# Patient Record
Sex: Male | Born: 1950 | Race: Black or African American | Hispanic: No | Marital: Married | Smoking: Former smoker
Health system: Southern US, Community
[De-identification: ages and names within clinical notes are randomized; demographics above are authoritative.]

## PROBLEM LIST (undated history)

## (undated) DIAGNOSIS — C84A0 Cutaneous T-cell lymphoma, unspecified, unspecified site: Secondary | ICD-10-CM

## (undated) DIAGNOSIS — E119 Type 2 diabetes mellitus without complications: Secondary | ICD-10-CM

## (undated) DIAGNOSIS — I10 Essential (primary) hypertension: Secondary | ICD-10-CM

## (undated) DIAGNOSIS — Z227 Latent tuberculosis: Secondary | ICD-10-CM

## (undated) DIAGNOSIS — C8400 Mycosis fungoides, unspecified site: Secondary | ICD-10-CM

## (undated) DIAGNOSIS — C4490 Unspecified malignant neoplasm of skin, unspecified: Secondary | ICD-10-CM

## (undated) HISTORY — PX: FRACTURE SURGERY: SHX138

## (undated) HISTORY — DX: Type 2 diabetes mellitus without complications: E11.9

## (undated) HISTORY — DX: Essential (primary) hypertension: I10

## (undated) HISTORY — DX: Cutaneous T-cell lymphoma, unspecified, unspecified site: C84.A0

---

## 2018-06-20 NOTE — Progress Notes (Deleted)
Patient's Name: Jose Randolph  MRN: [id number]  Referring Provider: Jodi Marble, MD  DOB: [birth year]  PCP: No primary care provider on file.  DOS: 06/25/2018  Note by: Gillis Santa, MD  Service setting: Ambulatory outpatient  Specialty: Interventional Pain Management  Location: [facility] Virtual Visit  Visit type: Initial Patient Evaluation  Patient type: New Patient   Pain Management Virtual Encounter Note - Virtual Visit via  ***  Telehealth (real-time audio visits between healthcare provider and patient).  Patient's Phone No.:  [phone] (home); There is no such number on file (mobile).; (Preferred) [phone] [email or web address] No Pharmacies Listed  Pre-screening note:  Our staff contacted Jose Randolph and offered him an "in person", "face-to-face" appointment versus a telephone encounter. He indicated preferring the telephone encounter, at this time.  Primary Reason(s) for Visit: Tele-Encounter for initial evaluation of one or more chronic problems (new to examiner) potentially causing chronic pain, and posing a threat to normal musculoskeletal function. (Level of risk: High) CC: No chief complaint on file.  I contacted Jose Randolph on 06/25/2018 at 9:50 AM via  ***  and clearly identified myself as Gillis Santa, MD. I verified that I was speaking with the correct person using two identifiers (Name and date of birth: 1950/10/01).  Advanced Informed Consent I sought verbal advanced consent from Jose Randolph for virtual visit interactions. I informed Jose Randolph of possible security and privacy concerns, risks, and limitations associated with providing "not-in-person" medical evaluation and management services. I also informed Jose Randolph of the availability of "in-person" appointments. Finally, I informed him that there would be a charge for the virtual visit and that he could be  personally, fully or partially, financially responsible for it. Jose Randolph expressed  understanding and agreed to proceed.   HPI  Jose Randolph is a 68 y.o. year old, male patient, contacted today for an initial evaluation of his chronic pain. He does not have a problem list on file.  Pain Assessment: Location:     Radiating:   Onset:   Duration:   Quality:   Severity:  /10 (subjective, self-reported pain score)  Note: Reported level is compatible with observation.                         When using our objective Pain Scale, levels between 6 and 10/10 are said to belong in an emergency room, as it progressively worsens from a 6/10, described as severely limiting, requiring emergency care not usually available at an outpatient pain management facility. At a 6/10 level, communication becomes difficult and requires great effort. Assistance to reach the emergency department may be required. Facial flushing and profuse sweating along with potentially dangerous increases in heart rate and blood pressure will be evident. Effect on ADL:   Timing:   Modifying factors:   BP:    HR:    Onset and Duration: {Hx; Onset and Duration:210120511} Cause of pain: {Hx; Cause:210120521} Severity: {Pain Severity:210120502} Timing: {Symptoms; Timing:210120501} Aggravating Factors: {Causes; Aggravating pain factors:210120507} Alleviating Factors: {Causes; Alleviating Factors:210120500} Associated Problems: {Hx; Associated problems:210120515} Quality of Pain: {Hx; Symptom quality or Descriptor:210120531} Previous Examinations or Tests: {Hx; Previous examinations or test:210120529} Previous Treatments: {Hx; Previous Treatment:210120503}  ***  The patient was informed that my practice is divided into two sections: an interventional pain management section, as well as a completely separate and distinct medication management section. I explained that I have procedure days for my interventional therapies, and evaluation days  for follow-ups and medication management. Because of the amount of  documentation required during both, they are kept separated. This means that there is the possibility that he may be scheduled for a procedure on one day, and medication management the next. I have also informed him that because of staffing and facility limitations, I no longer take patients for medication management only. To illustrate the reasons for this, I gave the patient the example of surgeons, and how inappropriate it would be to refer a patient to his/her care, just to write for the post-surgical antibiotics on a surgery done by a different surgeon.   Because interventional pain management is my board-certified specialty, the patient was informed that joining my practice means that they are open to any and all interventional therapies. I made it clear that this does not mean that they will be forced to have any procedures done. What this means is that I believe interventional therapies to be essential part of the diagnosis and proper management of chronic pain conditions. Therefore, patients not interested in these interventional alternatives will be better served under the care of a different practitioner.  The patient was also made aware of my Comprehensive Pain Management Safety Guidelines where by joining my practice, they limit all of their nerve blocks and joint injections to those done by our practice, for as long as we are retained to manage their care.   Historic Controlled Substance Pharmacotherapy Review  PMP and historical list of controlled substances: ***  Highest opioid analgesic regimen found: ***  Most recent opioid analgesic: ***  Current opioid analgesics: ***  Highest recorded MME/day: *** mg/day MME/day: *** mg/day Medications: The patient did not bring the medication(s) to the appointment, as requested in our "New Patient Package" Pharmacodynamics: Desired effects: Analgesia: The patient reports >50% benefit. Reported improvement in function: The patient reports  medication allows him to accomplish basic ADLs. Clinically meaningful improvement in function (CMIF): Sustained CMIF goals met Perceived effectiveness: Described as relatively effective, allowing for increase in activities of daily living (ADL) Undesirable effects: Side-effects or Adverse reactions: None reported Historical Monitoring: The patient  has no history on file for drug. List of all UDS Test(s): No results found for: MDMA, COCAINSCRNUR, [place], [place], CANNABQUANT, [place], [place] List of other Serum/Urine Drug Screening Test(s):  No results found for: AMPHSCRSER, BARBSCRSER, BENZOSCRSER, COCAINSCRSER, COCAINSCRNUR, PCPSCRSER, PCPQUANT, THCSCRSER, THCU, CANNABQUANT, OPIATESCRSER, OXYSCRSER, PROPOXSCRSER, ETH Historical Background Evaluation: [place] PMP: PDMP not reviewed this encounter. Six (6) year initial data search conducted.             PMP NARX Score Report:  Narcotic: *** Sedative: *** Stimulant: *** [place] Department of public safety, offender search: Editor, commissioning Information) Non-contributory Risk Assessment Profile: Aberrant behavior: None observed or detected today Risk factors for fatal opioid overdose: None identified today PMP NARX Overdose Risk Score: *** Fatal overdose hazard ratio (HR): Calculation deferred Non-fatal overdose hazard ratio (HR): Calculation deferred Risk of opioid abuse or dependence: 0.7-3.0% with doses ? 36 MME/day and 6.1-26% with doses ? 120 MME/day. Substance use disorder (SUD) risk level: See below Personal History of Substance Abuse (SUD-Substance use disorder):  Alcohol:    Illegal Drugs:    Rx Drugs:    ORT Risk Level calculation:    ORT Scoring interpretation table:  Score <3 = Low Risk for SUD  Score between 4-7 = Moderate Risk for SUD  Score >8 = High Risk for Opioid Abuse   PHQ-2 Depression Scale:  Total score:  PHQ-2 Scoring interpretation table: (Score and probability of major depressive disorder)  Score 0 = No depression  Score 1  = 15.4% Probability  Score 2 = 21.1% Probability  Score 3 = 38.4% Probability  Score 4 = 45.5% Probability  Score 5 = 56.4% Probability  Score 6 = 78.6% Probability   PHQ-9 Depression Scale:  Total score:    PHQ-9 Scoring interpretation table:  Score 0-4 = No depression  Score 5-9 = Mild depression  Score 10-14 = Moderate depression  Score 15-19 = Moderately severe depression  Score 20-27 = Severe depression (2.4 times higher risk of SUD and 2.89 times higher risk of overuse)   Pharmacologic Plan: As per protocol, I have not taken over any controlled substance management, pending the results of ordered tests and/or consults.            Initial impression: Pending review of available data and ordered tests.  Meds  No current outpatient medications on file.  ROS  Cardiovascular: {Hx; Cardiovascular History:210120525} Pulmonary or Respiratory: {Hx; Pumonary and/or Respiratory History:210120523} Neurological: {Hx; Neurological:210120504} Review of Past Neurological Studies: No results found for this or any previous visit. Psychological-Psychiatric: {Hx; Psychological-Psychiatric History:210120512} Gastrointestinal: {Hx; Gastrointestinal:210120527} Genitourinary: {Hx; Genitourinary:210120506} Hematological: {Hx; Hematological:210120510} Endocrine: {Hx; Endocrine history:210120509} Rheumatologic: {Hx; Rheumatological:210120530} Musculoskeletal: {Hx; Musculoskeletal:210120528} Work History: {Hx; Work history:210120514}  Allergies  Mr. Jenison has no allergies on file.  Laboratory Chemistry  Inflammation Markers (CRP: Acute Phase) (ESR: Chronic Phase) No results found for: CRP, ESRSEDRATE, LATICACIDVEN                       Rheumatology Markers No results found for: RF, ANA, LABURIC, URICUR, LYMEIGGIGMAB, LYMEABIGMQN, HLAB27                      Renal Function Markers No results found for: BUN, CREATININE, LABCREA, BCR, GFRAA, GFRNONAA, LABVMA, EPIRU, [id number], NOREPRU,  NOREPI24HUR, DOPARU, [id number]                           Hepatic Function Markers No results found for: AST, ALT, ALBUMIN, ALKPHOS, HCVAB, AMYLASE, LIPASE, AMMONIA                      Electrolytes No results found for: NA, K, CL, CALCIUM, MG, PHOS                      Neuropathy Markers No results found for: VITAMINB12, FOLATE, HGBA1C, HIV                      CNS Tests No results found for: COLORCSF, APPEARCSF, RBCCOUNTCSF, WBCCSF, POLYSCSF, LYMPHSCSF, EOSCSF, PROTEINCSF, GLUCCSF, JCVIRUS, CSFOLI, IGGCSF, LABACHR, ACETBL                      Bone Pathology Markers No results found for: VD25OH, [id number], [id number], [id number], 25OHVITD1, 25OHVITD2, 25OHVITD3, TESTOFREE, TESTOSTERONE                       Coagulation Parameters No results found for: INR, LABPROT, APTT, PLT, DDIMER, LABHEMA, VITAMINK1, AT3                      Cardiovascular Markers No results found for: BNP, CKTOTAL, CKMB, TROPONINI, HGB, HCT, LABVMA  ID Markers No results found for: LYMEIGGIGMAB, HIV                      CA Markers No results found for: CEA, CA125, LABCA2                      Endocrine Markers No results found for: TSH, FREET4, TESTOFREE, TESTOSTERONE, SHBG, ESTRADIOL, ESTRADIOLPCT, ESTRADIOLFRE, LABPREG, ACTH                      Note: Lab results reviewed.  Imaging Review  Cervical Imaging: Cervical MR wo contrast: No results found for this or any previous visit. Cervical MR wo contrast: No procedure found. Cervical MR w/wo contrast: No results found for this or any previous visit. Cervical MR w contrast: No results found for this or any previous visit. Cervical CT wo contrast: No results found for this or any previous visit. Cervical CT w/wo contrast: No results found for this or any previous visit. Cervical CT w/wo contrast: No results found for this or any previous visit. Cervical CT w contrast: No results found for this or any previous visit. Cervical CT  outside: No results found for this or any previous visit. Cervical DG 1 view: No results found for this or any previous visit. Cervical DG 2-3 views: No results found for this or any previous visit. Cervical DG F/E views: No results found for this or any previous visit. Cervical DG 2-3 clearing views: No results found for this or any previous visit. Cervical DG Bending/F/E views: No results found for this or any previous visit. Cervical DG complete: No results found for this or any previous visit. Cervical DG Myelogram views: No results found for this or any previous visit. Cervical DG Myelogram views: No results found for this or any previous visit. Cervical Discogram views: No results found for this or any previous visit.  Shoulder Imaging: Shoulder-R MR w contrast: No results found for this or any previous visit. Shoulder-L MR w contrast: No results found for this or any previous visit. Shoulder-R MR w/wo contrast: No results found for this or any previous visit. Shoulder-L MR w/wo contrast: No results found for this or any previous visit. Shoulder-R MR wo contrast: No results found for this or any previous visit. Shoulder-L MR wo contrast: No results found for this or any previous visit. Shoulder-R CT w contrast: No results found for this or any previous visit. Shoulder-L CT w contrast: No results found for this or any previous visit. Shoulder-R CT w/wo contrast: No results found for this or any previous visit. Shoulder-L CT w/wo contrast: No results found for this or any previous visit. Shoulder-R CT wo contrast: No results found for this or any previous visit. Shoulder-L CT wo contrast: No results found for this or any previous visit. Shoulder-R DG Arthrogram: No results found for this or any previous visit. Shoulder-L DG Arthrogram: No results found for this or any previous visit. Shoulder-R DG 1 view: No results found for this or any previous visit. Shoulder-L DG 1 view: No results  found for this or any previous visit. Shoulder-R DG: No results found for this or any previous visit. Shoulder-L DG: No results found for this or any previous visit.  Thoracic Imaging: Thoracic MR wo contrast: No results found for this or any previous visit. Thoracic MR wo contrast: No procedure found. Thoracic MR w/wo contrast: No results found for this or any previous  visit. Thoracic MR w contrast: No results found for this or any previous visit. Thoracic CT wo contrast: No results found for this or any previous visit. Thoracic CT w/wo contrast: No results found for this or any previous visit. Thoracic CT w/wo contrast: No results found for this or any previous visit. Thoracic CT w contrast: No results found for this or any previous visit. Thoracic DG 2-3 views: No results found for this or any previous visit. Thoracic DG 4 views: No results found for this or any previous visit. Thoracic DG: No results found for this or any previous visit. Thoracic DG w/swimmers view: No results found for this or any previous visit. Thoracic DG Myelogram views: No results found for this or any previous visit. Thoracic DG Myelogram views: No results found for this or any previous visit.  Lumbosacral Imaging: Lumbar MR wo contrast: No results found for this or any previous visit. Lumbar MR wo contrast: No procedure found. Lumbar MR w/wo contrast: No results found for this or any previous visit. Lumbar MR w/wo contrast: No results found for this or any previous visit. Lumbar MR w contrast: No results found for this or any previous visit. Lumbar CT wo contrast: No results found for this or any previous visit. Lumbar CT w/wo contrast: No results found for this or any previous visit. Lumbar CT w/wo contrast: No results found for this or any previous visit. Lumbar CT w contrast: No results found for this or any previous visit. Lumbar DG 1V: No results found for this or any previous visit. Lumbar DG 1V  (Clearing): No results found for this or any previous visit. Lumbar DG 2-3V (Clearing): No results found for this or any previous visit. Lumbar DG 2-3 views: No results found for this or any previous visit. Lumbar DG (Complete) 4+V: No results found for this or any previous visit.       Lumbar DG F/E views: No results found for this or any previous visit.       Lumbar DG Bending views: No results found for this or any previous visit.       Lumbar DG Myelogram views: No results found for this or any previous visit. Lumbar DG Myelogram: No results found for this or any previous visit. Lumbar DG Myelogram: No results found for this or any previous visit. Lumbar DG Myelogram: No results found for this or any previous visit. Lumbar DG Myelogram Lumbosacral: No results found for this or any previous visit. Lumbar DG Diskogram views: No results found for this or any previous visit. Lumbar DG Diskogram views: No results found for this or any previous visit. Lumbar DG Epidurogram OP: No results found for this or any previous visit. Lumbar DG Epidurogram IP: No results found for this or any previous visit.  Sacroiliac Joint Imaging: Sacroiliac Joint DG: No results found for this or any previous visit. Sacroiliac Joint MR w/wo contrast: No results found for this or any previous visit. Sacroiliac Joint MR wo contrast: No results found for this or any previous visit.  Spine Imaging: Whole Spine DG Myelogram views: No results found for this or any previous visit. Whole Spine MR Mets screen: No results found for this or any previous visit. Whole Spine MR Mets screen: No results found for this or any previous visit. Whole Spine MR w/wo: No results found for this or any previous visit. MRA Spinal Canal w/ cm: No results found for this or any previous visit. MRA Spinal Canal wo/ cm:  No procedure found. MRA Spinal Canal w/wo cm: No results found for this or any previous visit. Spine Outside MR Films: No  results found for this or any previous visit. Spine Outside CT Films: No results found for this or any previous visit. CT-Guided Biopsy: No results found for this or any previous visit. CT-Guided Needle Placement: No results found for this or any previous visit. DG Spine outside: No results found for this or any previous visit. IR Spine outside: No results found for this or any previous visit. NM Spine outside: No results found for this or any previous visit.  Hip Imaging: Hip-R MR w contrast: No results found for this or any previous visit. Hip-L MR w contrast: No results found for this or any previous visit. Hip-R MR w/wo contrast: No results found for this or any previous visit. Hip-L MR w/wo contrast: No results found for this or any previous visit. Hip-R MR wo contrast: No results found for this or any previous visit. Hip-L MR wo contrast: No results found for this or any previous visit. Hip-R CT w contrast: No results found for this or any previous visit. Hip-L CT w contrast: No results found for this or any previous visit. Hip-R CT w/wo contrast: No results found for this or any previous visit. Hip-L CT w/wo contrast: No results found for this or any previous visit. Hip-R CT wo contrast: No results found for this or any previous visit. Hip-L CT wo contrast: No results found for this or any previous visit. Hip-R DG 2-3 views: No results found for this or any previous visit. Hip-L DG 2-3 views: No results found for this or any previous visit. Hip-R DG Arthrogram: No results found for this or any previous visit. Hip-L DG Arthrogram: No results found for this or any previous visit. Hip-B DG Bilateral: No results found for this or any previous visit.  Knee Imaging: Knee-R MR w contrast: No results found for this or any previous visit. Knee-L MR w contrast: No results found for this or any previous visit. Knee-R MR w/wo contrast: No results found for this or any previous visit. Knee-L  MR w/wo contrast: No results found for this or any previous visit. Knee-R MR wo contrast: No results found for this or any previous visit. Knee-L MR wo contrast: No results found for this or any previous visit. Knee-R CT w contrast: No results found for this or any previous visit. Knee-L CT w contrast: No results found for this or any previous visit. Knee-R CT w/wo contrast: No results found for this or any previous visit. Knee-L CT w/wo contrast: No results found for this or any previous visit. Knee-R CT wo contrast: No results found for this or any previous visit. Knee-L CT wo contrast: No results found for this or any previous visit. Knee-R DG 1-2 views: No results found for this or any previous visit. Knee-L DG 1-2 views: No results found for this or any previous visit. Knee-R DG 3 views: No results found for this or any previous visit. Knee-L DG 3 views: No results found for this or any previous visit. Knee-R DG 4 views: No results found for this or any previous visit. Knee-L DG 4 views: No results found for this or any previous visit. Knee-R DG Arthrogram: No results found for this or any previous visit. Knee-L DG Arthrogram: No results found for this or any previous visit.  Ankle Imaging: Ankle-R DG Complete: No results found for this or any previous  visit. Ankle-L DG Complete: No results found for this or any previous visit.  Foot Imaging: Foot-R DG Complete: No results found for this or any previous visit. Foot-L DG Complete: No results found for this or any previous visit.  Elbow Imaging: Elbow-R DG Complete: No results found for this or any previous visit. Elbow-L DG Complete: No results found for this or any previous visit.  Wrist Imaging: Wrist-R DG Complete: No results found for this or any previous visit. Wrist-L DG Complete: No results found for this or any previous visit.  Hand Imaging: Hand-R DG Complete: No results found for this or any previous visit. Hand-L DG  Complete: No results found for this or any previous visit.  Complexity Note: Imaging results reviewed. Results shared with Mr. Newman, using Layman's terms.                         PFSH  Drug: Mr. Lemelin  has no history on file for drug. Alcohol:  has no history on file for alcohol. Tobacco:  has no history on file for tobacco. Medical:  has no past medical history on file. Family: family history is not on file.  *** The histories are not reviewed yet. Please review them in the "History" navigator section and refresh this [place]. Active Ambulatory Problems    Diagnosis Date Noted  . No Active Ambulatory Problems   Resolved Ambulatory Problems    Diagnosis Date Noted  . No Resolved Ambulatory Problems   No Additional Past Medical History   Assessment  Primary Diagnosis & Pertinent Problem List: There were no encounter diagnoses.  Visit Diagnosis (New problems to examiner): No diagnosis found. Plan of Care (Initial workup plan)  Note: Mr. Rudnick was reminded that as per protocol, today's visit has been an evaluation only. We have not taken over the patient's controlled substance management.  Problem-specific plan: No problem-specific Assessment & Plan notes found for this encounter.  Lab Orders  No laboratory test(s) ordered today   Imaging Orders  No imaging studies ordered today   Referral Orders  No referral(s) requested today   Procedure Orders    No procedure(s) ordered today   Pharmacotherapy (current): Medications ordered:  No orders of the defined types were placed in this encounter.  Medications administered during this visit: Leanor Kail. Amor had no medications administered during this visit.   Pharmacological management options:  Opioid Analgesics: The patient was informed that there is no guarantee that he would be a candidate for opioid analgesics. The decision will be made following CDC guidelines. This decision will be based on the results of  diagnostic studies, as well as Mr. Spray's risk profile.   Membrane stabilizer: To be determined at a later time  Muscle relaxant: To be determined at a later time  NSAID: To be determined at a later time  Other analgesic(s): To be determined at a later time   Interventional management options: Mr. Hallam was informed that there is no guarantee that he would be a candidate for interventional therapies. The decision will be based on the results of diagnostic studies, as well as Mr. Burbach's risk profile.  Procedure(s) under consideration:  ***   Provider-requested follow-up: No follow-ups on file.  Future Appointments  Date Time Provider [place]  06/25/2018  2:00 PM Gillis Santa, MD [facility] None    Primary Care Physician: No primary care provider on file. Location: [facility] Note by: Gillis Santa, MD  Date: 06/25/2018; Time: 9:50 AM

## 2018-06-25 ENCOUNTER — Ambulatory Visit: Payer: Medicare Other | Admitting: Student in an Organized Health Care Education/Training Program

## 2018-09-26 ENCOUNTER — Other Ambulatory Visit: Payer: Self-pay

## 2018-09-26 DIAGNOSIS — Z20822 Contact with and (suspected) exposure to covid-19: Secondary | ICD-10-CM

## 2018-09-29 LAB — NOVEL CORONAVIRUS, NAA: SARS-CoV-2, NAA: DETECTED — AB

## 2019-01-21 DIAGNOSIS — D179 Benign lipomatous neoplasm, unspecified: Secondary | ICD-10-CM

## 2019-01-21 HISTORY — DX: Benign lipomatous neoplasm, unspecified: D17.9

## 2019-05-13 ENCOUNTER — Other Ambulatory Visit: Payer: Self-pay

## 2019-05-13 DIAGNOSIS — C8400 Mycosis fungoides, unspecified site: Secondary | ICD-10-CM

## 2019-05-20 ENCOUNTER — Ambulatory Visit (INDEPENDENT_AMBULATORY_CARE_PROVIDER_SITE_OTHER): Payer: Medicare Other

## 2019-05-20 ENCOUNTER — Other Ambulatory Visit: Payer: Self-pay

## 2019-05-20 DIAGNOSIS — C8400 Mycosis fungoides, unspecified site: Secondary | ICD-10-CM

## 2019-05-20 NOTE — Progress Notes (Signed)
Patient treated today in the lightbox/NBUVB for 7 minutes and 42 seconds.

## 2019-05-22 ENCOUNTER — Ambulatory Visit (INDEPENDENT_AMBULATORY_CARE_PROVIDER_SITE_OTHER): Payer: Medicare Other

## 2019-05-22 ENCOUNTER — Other Ambulatory Visit: Payer: Self-pay

## 2019-05-22 DIAGNOSIS — C8400 Mycosis fungoides, unspecified site: Secondary | ICD-10-CM

## 2019-05-22 NOTE — Progress Notes (Signed)
Patient treated in the NBUVB/Lightbox today for 7 minutes and 42 seconds.

## 2019-05-27 ENCOUNTER — Ambulatory Visit (INDEPENDENT_AMBULATORY_CARE_PROVIDER_SITE_OTHER): Payer: Medicare Other

## 2019-05-27 ENCOUNTER — Other Ambulatory Visit: Payer: Self-pay

## 2019-05-27 DIAGNOSIS — C8400 Mycosis fungoides, unspecified site: Secondary | ICD-10-CM | POA: Diagnosis not present

## 2019-05-27 NOTE — Progress Notes (Signed)
Patient treated in the lightbox/NBUVB for 7 minutes and 42 seconds.   

## 2019-05-29 ENCOUNTER — Ambulatory Visit (INDEPENDENT_AMBULATORY_CARE_PROVIDER_SITE_OTHER): Payer: Medicare Other

## 2019-05-29 ENCOUNTER — Other Ambulatory Visit: Payer: Self-pay

## 2019-05-29 DIAGNOSIS — C8400 Mycosis fungoides, unspecified site: Secondary | ICD-10-CM | POA: Diagnosis not present

## 2019-05-29 NOTE — Progress Notes (Signed)
Patient treated in the lightbox/NBUVB for 7 minutes and 42 seconds.   

## 2019-06-03 ENCOUNTER — Ambulatory Visit (INDEPENDENT_AMBULATORY_CARE_PROVIDER_SITE_OTHER): Payer: Medicare Other

## 2019-06-03 ENCOUNTER — Other Ambulatory Visit: Payer: Self-pay

## 2019-06-03 DIAGNOSIS — C8400 Mycosis fungoides, unspecified site: Secondary | ICD-10-CM

## 2019-06-03 NOTE — Progress Notes (Signed)
Patient treated in the lightbox/NBUVB for 7 minutes and 42 seconds.   

## 2019-06-05 ENCOUNTER — Ambulatory Visit (INDEPENDENT_AMBULATORY_CARE_PROVIDER_SITE_OTHER): Payer: Medicare Other

## 2019-06-05 ENCOUNTER — Other Ambulatory Visit: Payer: Self-pay

## 2019-06-05 DIAGNOSIS — C8400 Mycosis fungoides, unspecified site: Secondary | ICD-10-CM

## 2019-06-05 NOTE — Progress Notes (Signed)
Patient treated in the lightbox/NBUVB for 7 minutes and 42 seconds.   

## 2019-06-10 ENCOUNTER — Other Ambulatory Visit: Payer: Self-pay

## 2019-06-10 ENCOUNTER — Ambulatory Visit (INDEPENDENT_AMBULATORY_CARE_PROVIDER_SITE_OTHER): Payer: Medicare Other

## 2019-06-10 DIAGNOSIS — C8400 Mycosis fungoides, unspecified site: Secondary | ICD-10-CM

## 2019-06-10 NOTE — Progress Notes (Signed)
Patient treated in the lightbox/NBUVB for 7 minutes and 42 seconds.   

## 2019-06-12 ENCOUNTER — Ambulatory Visit (INDEPENDENT_AMBULATORY_CARE_PROVIDER_SITE_OTHER): Payer: Medicare Other

## 2019-06-12 ENCOUNTER — Other Ambulatory Visit: Payer: Self-pay

## 2019-06-12 DIAGNOSIS — C8400 Mycosis fungoides, unspecified site: Secondary | ICD-10-CM

## 2019-06-12 NOTE — Progress Notes (Signed)
Patient treated in the lightbox/NBUVB for 7 minutes and 42 seconds.   

## 2019-06-17 ENCOUNTER — Ambulatory Visit (INDEPENDENT_AMBULATORY_CARE_PROVIDER_SITE_OTHER): Payer: Medicare Other

## 2019-06-17 ENCOUNTER — Other Ambulatory Visit: Payer: Self-pay

## 2019-06-17 DIAGNOSIS — C8400 Mycosis fungoides, unspecified site: Secondary | ICD-10-CM

## 2019-06-17 NOTE — Progress Notes (Signed)
Patient treated in the lightbox/NBUVB for 7 minutes and 42 seconds.   

## 2019-06-19 ENCOUNTER — Encounter: Payer: Self-pay | Admitting: Dermatology

## 2019-06-19 ENCOUNTER — Ambulatory Visit (INDEPENDENT_AMBULATORY_CARE_PROVIDER_SITE_OTHER): Payer: Medicare Other | Admitting: Dermatology

## 2019-06-19 ENCOUNTER — Ambulatory Visit (INDEPENDENT_AMBULATORY_CARE_PROVIDER_SITE_OTHER): Payer: Medicare Other

## 2019-06-19 ENCOUNTER — Other Ambulatory Visit: Payer: Self-pay

## 2019-06-19 DIAGNOSIS — C8400 Mycosis fungoides, unspecified site: Secondary | ICD-10-CM

## 2019-06-19 NOTE — Progress Notes (Signed)
Patient treated in the lightbox/NBUVB for 7 minutes and 42 seconds.   

## 2019-06-19 NOTE — Progress Notes (Signed)
   Follow-Up Visit   Subjective  Jose Randolph is a 69 y.o. male who presents for the following: Follow-up (Mycosis Fungoides follow up - Treating with NBUVB 2 times per week and Betamethasone cream qod. He has not seen Dr. Irish Elders in person in over a year but had a virtual visit last October.).  The patient feels his CTCL is improved.  He is planning on getting appoint with Dr. Jeannine Kitten soon.  The following portions of the chart were reviewed this encounter and updated as appropriate: Tobacco  Allergies  Meds  Problems  Med Hx  Surg Hx  Fam Hx      Review of Systems: No other skin or systemic complaints.  Objective  Well appearing patient in no apparent distress; mood and affect are within normal limits.  A focused examination was performed including trunk, arms and legs. Relevant physical exam findings are noted in the Assessment and Plan.  Objective  Trunk, arms and legs: Hyperpigmented cigarette-paper patches of trunk, arms and legs.           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 Continue Betamethasone cream qod prn.  Recommend he schedule a follow up appointment with Dr. Irish Elders at [facility] dermatology sometime soon.  Other Related Procedures Phototherapy Treatment  Return in about 4 months (around 10/19/2019).   I, Ashok Cordia, CMA, am acting as scribe for Sarina Ser, MD . Documentation: I have reviewed the above documentation for accuracy and completeness, and I agree with the above.  Sarina Ser, MD

## 2019-06-24 ENCOUNTER — Other Ambulatory Visit: Payer: Self-pay

## 2019-06-24 ENCOUNTER — Ambulatory Visit (INDEPENDENT_AMBULATORY_CARE_PROVIDER_SITE_OTHER): Payer: Medicare Other

## 2019-06-24 DIAGNOSIS — C8400 Mycosis fungoides, unspecified site: Secondary | ICD-10-CM

## 2019-06-24 NOTE — Progress Notes (Signed)
Patient treated in the lightbox/NBUVB for 7 minutes and 42 seconds.   

## 2019-06-26 ENCOUNTER — Ambulatory Visit (INDEPENDENT_AMBULATORY_CARE_PROVIDER_SITE_OTHER): Payer: Medicare Other

## 2019-06-26 ENCOUNTER — Other Ambulatory Visit: Payer: Self-pay

## 2019-06-26 DIAGNOSIS — C8400 Mycosis fungoides, unspecified site: Secondary | ICD-10-CM | POA: Diagnosis not present

## 2019-06-26 NOTE — Progress Notes (Signed)
Patient treated in the lightbox/NBUVB for 7 minutes and 42 seconds.   

## 2019-07-01 ENCOUNTER — Ambulatory Visit (INDEPENDENT_AMBULATORY_CARE_PROVIDER_SITE_OTHER): Payer: Medicare Other

## 2019-07-01 ENCOUNTER — Other Ambulatory Visit: Payer: Self-pay

## 2019-07-01 DIAGNOSIS — C8400 Mycosis fungoides, unspecified site: Secondary | ICD-10-CM

## 2019-07-01 NOTE — Progress Notes (Signed)
Patient treated in the lightbox/NBUVB for 7 minutes and 42 seconds.   

## 2019-07-03 ENCOUNTER — Other Ambulatory Visit: Payer: Self-pay

## 2019-07-03 ENCOUNTER — Ambulatory Visit (INDEPENDENT_AMBULATORY_CARE_PROVIDER_SITE_OTHER): Payer: Medicare Other

## 2019-07-03 DIAGNOSIS — C8400 Mycosis fungoides, unspecified site: Secondary | ICD-10-CM

## 2019-07-03 NOTE — Progress Notes (Signed)
Patient treated in the lightbox/NBUVB for 7 minutes and 42 seconds.   

## 2019-07-08 ENCOUNTER — Other Ambulatory Visit: Payer: Self-pay

## 2019-07-08 ENCOUNTER — Ambulatory Visit (INDEPENDENT_AMBULATORY_CARE_PROVIDER_SITE_OTHER): Payer: Medicare Other

## 2019-07-08 DIAGNOSIS — C8400 Mycosis fungoides, unspecified site: Secondary | ICD-10-CM | POA: Diagnosis not present

## 2019-07-08 NOTE — Progress Notes (Signed)
Patient treated in the lightbox for 7 minutes and 42 seconds.

## 2019-07-10 ENCOUNTER — Ambulatory Visit (INDEPENDENT_AMBULATORY_CARE_PROVIDER_SITE_OTHER): Payer: Medicare Other

## 2019-07-10 ENCOUNTER — Other Ambulatory Visit: Payer: Self-pay

## 2019-07-10 DIAGNOSIS — C8400 Mycosis fungoides, unspecified site: Secondary | ICD-10-CM

## 2019-07-10 NOTE — Progress Notes (Signed)
Patient treated in the lightbox/NBUVB for 7 minutes and 42 seconds.   

## 2019-07-15 ENCOUNTER — Other Ambulatory Visit: Payer: Self-pay

## 2019-07-15 ENCOUNTER — Ambulatory Visit (INDEPENDENT_AMBULATORY_CARE_PROVIDER_SITE_OTHER): Payer: Medicare Other

## 2019-07-15 DIAGNOSIS — C8400 Mycosis fungoides, unspecified site: Secondary | ICD-10-CM

## 2019-07-15 NOTE — Progress Notes (Signed)
Patient treated in the lightbox/NBUVB for 7 minutes and 42 seconds.   

## 2019-07-17 ENCOUNTER — Ambulatory Visit (INDEPENDENT_AMBULATORY_CARE_PROVIDER_SITE_OTHER): Payer: Medicare Other

## 2019-07-17 ENCOUNTER — Other Ambulatory Visit: Payer: Self-pay

## 2019-07-17 DIAGNOSIS — C8400 Mycosis fungoides, unspecified site: Secondary | ICD-10-CM | POA: Diagnosis not present

## 2019-07-17 NOTE — Progress Notes (Signed)
Pt txted in the Lightbox/NBUVB for 7 minutes and 42 seconds.

## 2019-07-22 ENCOUNTER — Ambulatory Visit (INDEPENDENT_AMBULATORY_CARE_PROVIDER_SITE_OTHER): Payer: Medicare Other

## 2019-07-22 ENCOUNTER — Other Ambulatory Visit: Payer: Self-pay

## 2019-07-22 DIAGNOSIS — C8400 Mycosis fungoides, unspecified site: Secondary | ICD-10-CM

## 2019-07-22 NOTE — Progress Notes (Signed)
Patient treated in the lightbox/NBUVB for 7 minutes and 42 seconds.   

## 2019-07-24 ENCOUNTER — Other Ambulatory Visit: Payer: Self-pay

## 2019-07-24 ENCOUNTER — Ambulatory Visit (INDEPENDENT_AMBULATORY_CARE_PROVIDER_SITE_OTHER): Payer: Medicare Other

## 2019-07-24 DIAGNOSIS — C8400 Mycosis fungoides, unspecified site: Secondary | ICD-10-CM

## 2019-07-24 NOTE — Progress Notes (Signed)
Patient treated in the lightbox/NBUVB for 7 minutes and 42 seconds.   

## 2019-07-29 ENCOUNTER — Ambulatory Visit (INDEPENDENT_AMBULATORY_CARE_PROVIDER_SITE_OTHER): Payer: Medicare Other

## 2019-07-29 ENCOUNTER — Other Ambulatory Visit: Payer: Self-pay

## 2019-07-29 DIAGNOSIS — C8400 Mycosis fungoides, unspecified site: Secondary | ICD-10-CM

## 2019-07-29 NOTE — Progress Notes (Signed)
Patient treated in the lightbox/NBUVB for 7 minutes and 42 seconds.   

## 2019-07-31 ENCOUNTER — Other Ambulatory Visit: Payer: Self-pay

## 2019-07-31 ENCOUNTER — Ambulatory Visit (INDEPENDENT_AMBULATORY_CARE_PROVIDER_SITE_OTHER): Payer: Medicare Other

## 2019-07-31 DIAGNOSIS — C8400 Mycosis fungoides, unspecified site: Secondary | ICD-10-CM

## 2019-07-31 NOTE — Progress Notes (Signed)
Patient treated in the lightbox/NBUVB for 7 minutes and 42 seconds.   

## 2019-08-05 ENCOUNTER — Other Ambulatory Visit: Payer: Self-pay

## 2019-08-05 ENCOUNTER — Ambulatory Visit (INDEPENDENT_AMBULATORY_CARE_PROVIDER_SITE_OTHER): Payer: Medicare Other

## 2019-08-05 DIAGNOSIS — C8400 Mycosis fungoides, unspecified site: Secondary | ICD-10-CM

## 2019-08-05 NOTE — Progress Notes (Signed)
Patient treated in the lightbox/NBUVB for 7 minutes and 42 seconds.   

## 2019-08-07 ENCOUNTER — Other Ambulatory Visit: Payer: Self-pay

## 2019-08-07 ENCOUNTER — Ambulatory Visit (INDEPENDENT_AMBULATORY_CARE_PROVIDER_SITE_OTHER): Payer: Medicare Other

## 2019-08-07 DIAGNOSIS — C8400 Mycosis fungoides, unspecified site: Secondary | ICD-10-CM | POA: Diagnosis not present

## 2019-08-07 NOTE — Progress Notes (Signed)
Patient treated in the lightbox/NBUVB for 7 minutes and 42 seconds.   

## 2019-08-12 ENCOUNTER — Ambulatory Visit (INDEPENDENT_AMBULATORY_CARE_PROVIDER_SITE_OTHER): Payer: Medicare Other

## 2019-08-12 ENCOUNTER — Other Ambulatory Visit: Payer: Self-pay

## 2019-08-12 DIAGNOSIS — C8400 Mycosis fungoides, unspecified site: Secondary | ICD-10-CM | POA: Diagnosis not present

## 2019-08-12 NOTE — Progress Notes (Signed)
Patient treated in Lightbox/NVUVB for 7 minutes 42 seconds.

## 2019-08-14 ENCOUNTER — Ambulatory Visit (INDEPENDENT_AMBULATORY_CARE_PROVIDER_SITE_OTHER): Payer: Medicare Other

## 2019-08-14 ENCOUNTER — Other Ambulatory Visit: Payer: Self-pay

## 2019-08-14 DIAGNOSIS — C8400 Mycosis fungoides, unspecified site: Secondary | ICD-10-CM

## 2019-08-14 NOTE — Progress Notes (Signed)
Patient treated in Lightbox/NVUVB for 7 minutes 42 seconds.

## 2019-08-19 ENCOUNTER — Other Ambulatory Visit: Payer: Self-pay

## 2019-08-19 ENCOUNTER — Ambulatory Visit (INDEPENDENT_AMBULATORY_CARE_PROVIDER_SITE_OTHER): Payer: Medicare Other

## 2019-08-19 DIAGNOSIS — C8400 Mycosis fungoides, unspecified site: Secondary | ICD-10-CM

## 2019-08-19 NOTE — Progress Notes (Signed)
Patient treated in the lightbox/NBUVB for 7 minutes and 42 seconds.   

## 2019-08-21 ENCOUNTER — Other Ambulatory Visit: Payer: Self-pay

## 2019-08-21 ENCOUNTER — Ambulatory Visit (INDEPENDENT_AMBULATORY_CARE_PROVIDER_SITE_OTHER): Payer: Medicare Other

## 2019-08-21 DIAGNOSIS — C8400 Mycosis fungoides, unspecified site: Secondary | ICD-10-CM

## 2019-08-21 NOTE — Progress Notes (Signed)
Patient treated in the lightbox/NBUVB for 7 minutes and 42 seconds.   

## 2019-08-26 ENCOUNTER — Ambulatory Visit (INDEPENDENT_AMBULATORY_CARE_PROVIDER_SITE_OTHER): Payer: Medicare Other

## 2019-08-26 ENCOUNTER — Other Ambulatory Visit: Payer: Self-pay

## 2019-08-26 DIAGNOSIS — C8400 Mycosis fungoides, unspecified site: Secondary | ICD-10-CM | POA: Diagnosis not present

## 2019-08-26 NOTE — Progress Notes (Signed)
Patient treated in the lightbox/NBUVB for 7 minutes and 42 seconds.   

## 2019-08-28 ENCOUNTER — Ambulatory Visit (INDEPENDENT_AMBULATORY_CARE_PROVIDER_SITE_OTHER): Payer: Medicare Other

## 2019-08-28 ENCOUNTER — Other Ambulatory Visit: Payer: Self-pay

## 2019-08-28 DIAGNOSIS — C8400 Mycosis fungoides, unspecified site: Secondary | ICD-10-CM | POA: Diagnosis not present

## 2019-08-28 NOTE — Progress Notes (Signed)
Patient treated in the lightbox/NBUVB for 7 minutes and 42 seconds.   

## 2019-09-01 ENCOUNTER — Ambulatory Visit (INDEPENDENT_AMBULATORY_CARE_PROVIDER_SITE_OTHER): Payer: Medicare Other

## 2019-09-01 ENCOUNTER — Other Ambulatory Visit: Payer: Self-pay

## 2019-09-01 DIAGNOSIS — C8400 Mycosis fungoides, unspecified site: Secondary | ICD-10-CM | POA: Diagnosis not present

## 2019-09-01 NOTE — Progress Notes (Signed)
Patient treated in the lightbox/NBUVB for 7 minutes and 42 seconds.   

## 2019-09-02 ENCOUNTER — Ambulatory Visit: Payer: Medicare Other

## 2019-09-04 ENCOUNTER — Other Ambulatory Visit: Payer: Self-pay

## 2019-09-04 ENCOUNTER — Ambulatory Visit (INDEPENDENT_AMBULATORY_CARE_PROVIDER_SITE_OTHER): Payer: Medicare Other

## 2019-09-04 DIAGNOSIS — C8400 Mycosis fungoides, unspecified site: Secondary | ICD-10-CM | POA: Diagnosis not present

## 2019-09-04 NOTE — Progress Notes (Signed)
Patient treated in the lightbox/NBUVB for 7 minutes and 42 seconds.   

## 2019-09-09 ENCOUNTER — Other Ambulatory Visit: Payer: Self-pay

## 2019-09-09 ENCOUNTER — Ambulatory Visit (INDEPENDENT_AMBULATORY_CARE_PROVIDER_SITE_OTHER): Payer: Medicare Other

## 2019-09-09 DIAGNOSIS — C8400 Mycosis fungoides, unspecified site: Secondary | ICD-10-CM

## 2019-09-09 NOTE — Progress Notes (Signed)
Patient treated in the lightbox/NBUVB for 7 minutes and 42 seconds.   

## 2019-09-11 ENCOUNTER — Ambulatory Visit (INDEPENDENT_AMBULATORY_CARE_PROVIDER_SITE_OTHER): Payer: Medicare Other

## 2019-09-11 ENCOUNTER — Other Ambulatory Visit: Payer: Self-pay

## 2019-09-11 DIAGNOSIS — C8400 Mycosis fungoides, unspecified site: Secondary | ICD-10-CM | POA: Diagnosis not present

## 2019-09-11 NOTE — Progress Notes (Signed)
Patient treated in the lightbox/NBUVB for 7 minutes and 42 seconds.   

## 2019-09-16 ENCOUNTER — Other Ambulatory Visit: Payer: Self-pay

## 2019-09-16 ENCOUNTER — Ambulatory Visit (INDEPENDENT_AMBULATORY_CARE_PROVIDER_SITE_OTHER): Payer: Medicare Other

## 2019-09-16 DIAGNOSIS — C8400 Mycosis fungoides, unspecified site: Secondary | ICD-10-CM

## 2019-09-16 NOTE — Progress Notes (Signed)
Patient treated in the lightbox/NBUVB for 7 minutes and 42 seconds.   

## 2019-09-18 ENCOUNTER — Other Ambulatory Visit: Payer: Self-pay

## 2019-09-18 ENCOUNTER — Ambulatory Visit (INDEPENDENT_AMBULATORY_CARE_PROVIDER_SITE_OTHER): Payer: Medicare Other

## 2019-09-18 DIAGNOSIS — C8400 Mycosis fungoides, unspecified site: Secondary | ICD-10-CM | POA: Diagnosis not present

## 2019-09-18 NOTE — Progress Notes (Signed)
Patient treated in the lightbox/NBUVB for 7 minutes and 42 seconds.   

## 2019-09-23 ENCOUNTER — Other Ambulatory Visit: Payer: Self-pay

## 2019-09-23 ENCOUNTER — Ambulatory Visit (INDEPENDENT_AMBULATORY_CARE_PROVIDER_SITE_OTHER): Payer: Medicare Other

## 2019-09-23 DIAGNOSIS — C8400 Mycosis fungoides, unspecified site: Secondary | ICD-10-CM | POA: Diagnosis not present

## 2019-09-23 NOTE — Progress Notes (Signed)
Patient treated in lightbox for 7 minutes 42 seconds, JS

## 2019-09-25 ENCOUNTER — Ambulatory Visit: Payer: Medicare Other

## 2019-09-30 ENCOUNTER — Other Ambulatory Visit: Payer: Self-pay

## 2019-09-30 ENCOUNTER — Ambulatory Visit (INDEPENDENT_AMBULATORY_CARE_PROVIDER_SITE_OTHER): Payer: Medicare Other

## 2019-09-30 DIAGNOSIS — C8400 Mycosis fungoides, unspecified site: Secondary | ICD-10-CM | POA: Diagnosis not present

## 2019-09-30 NOTE — Progress Notes (Signed)
Patient treated in the lightbox/NBUVB for 7 minutes and 42 seconds.   

## 2019-10-02 ENCOUNTER — Ambulatory Visit: Payer: Medicare Other

## 2019-10-07 ENCOUNTER — Ambulatory Visit (INDEPENDENT_AMBULATORY_CARE_PROVIDER_SITE_OTHER): Payer: Medicare Other

## 2019-10-07 ENCOUNTER — Other Ambulatory Visit: Payer: Self-pay

## 2019-10-07 DIAGNOSIS — C8400 Mycosis fungoides, unspecified site: Secondary | ICD-10-CM

## 2019-10-07 NOTE — Progress Notes (Signed)
Patient treated in the lightbox/NBUVB for 7 minutes and 42 seconds.   

## 2019-10-09 ENCOUNTER — Ambulatory Visit: Payer: Medicare Other

## 2019-10-14 ENCOUNTER — Other Ambulatory Visit: Payer: Self-pay

## 2019-10-14 ENCOUNTER — Ambulatory Visit (INDEPENDENT_AMBULATORY_CARE_PROVIDER_SITE_OTHER): Payer: Medicare Other

## 2019-10-14 DIAGNOSIS — C8400 Mycosis fungoides, unspecified site: Secondary | ICD-10-CM | POA: Diagnosis not present

## 2019-10-14 NOTE — Progress Notes (Signed)
Patient treated in the lightbox/NBUVB for 7 minutes and 42 seconds.   

## 2019-10-16 ENCOUNTER — Ambulatory Visit: Payer: Medicare Other | Admitting: Dermatology

## 2019-10-16 ENCOUNTER — Ambulatory Visit: Payer: Medicare Other

## 2019-10-21 ENCOUNTER — Other Ambulatory Visit: Payer: Self-pay

## 2019-10-21 ENCOUNTER — Ambulatory Visit (INDEPENDENT_AMBULATORY_CARE_PROVIDER_SITE_OTHER): Payer: Medicare Other

## 2019-10-21 DIAGNOSIS — C8400 Mycosis fungoides, unspecified site: Secondary | ICD-10-CM

## 2019-10-21 NOTE — Progress Notes (Signed)
Patient treated in the lightbox/NBUVB for 7 minutes and 42 seconds.   

## 2019-10-24 ENCOUNTER — Other Ambulatory Visit
Admission: RE | Admit: 2019-10-24 | Discharge: 2019-10-24 | Disposition: A | Discharge status: Home / Self Care | Payer: Medicare Other | Source: Ambulatory Visit | Attending: Gastroenterology | Admitting: Gastroenterology

## 2019-10-24 ENCOUNTER — Other Ambulatory Visit: Payer: Self-pay

## 2019-10-24 DIAGNOSIS — Z01812 Encounter for preprocedural laboratory examination: Secondary | ICD-10-CM | POA: Diagnosis present

## 2019-10-24 DIAGNOSIS — Z20822 Contact with and (suspected) exposure to covid-19: Secondary | ICD-10-CM | POA: Insufficient documentation

## 2019-10-24 LAB — SARS CORONAVIRUS 2 (TAT 6-24 HRS): SARS Coronavirus 2: NEGATIVE

## 2019-10-27 ENCOUNTER — Other Ambulatory Visit: Payer: Self-pay

## 2019-10-27 ENCOUNTER — Ambulatory Visit (INDEPENDENT_AMBULATORY_CARE_PROVIDER_SITE_OTHER): Payer: Medicare Other

## 2019-10-27 DIAGNOSIS — C8400 Mycosis fungoides, unspecified site: Secondary | ICD-10-CM

## 2019-10-27 NOTE — Progress Notes (Signed)
Patient treated in the lightbox/NBUVB for 7 minutes and 42 seconds.   

## 2019-10-28 ENCOUNTER — Ambulatory Visit: Payer: Medicare Other | Admitting: Anesthesiology

## 2019-10-28 ENCOUNTER — Encounter
Admission: RE | Disposition: A | Discharge status: Home / Self Care | Payer: Self-pay | Source: Home / Self Care | Attending: Gastroenterology

## 2019-10-28 ENCOUNTER — Ambulatory Visit
Admission: RE | Admit: 2019-10-28 | Discharge: 2019-10-28 | Disposition: A | Discharge status: Home / Self Care | Payer: Medicare Other | Attending: Gastroenterology | Admitting: Gastroenterology

## 2019-10-28 ENCOUNTER — Encounter: Payer: Self-pay | Admitting: *Deleted

## 2019-10-28 ENCOUNTER — Other Ambulatory Visit: Payer: Self-pay

## 2019-10-28 DIAGNOSIS — Z800 Family history of malignant neoplasm of digestive organs: Secondary | ICD-10-CM | POA: Diagnosis not present

## 2019-10-28 DIAGNOSIS — Z885 Allergy status to narcotic agent status: Secondary | ICD-10-CM | POA: Insufficient documentation

## 2019-10-28 DIAGNOSIS — Z7984 Long term (current) use of oral hypoglycemic drugs: Secondary | ICD-10-CM | POA: Diagnosis not present

## 2019-10-28 DIAGNOSIS — D175 Benign lipomatous neoplasm of intra-abdominal organs: Secondary | ICD-10-CM | POA: Insufficient documentation

## 2019-10-28 DIAGNOSIS — Z79899 Other long term (current) drug therapy: Secondary | ICD-10-CM | POA: Diagnosis not present

## 2019-10-28 DIAGNOSIS — D120 Benign neoplasm of cecum: Secondary | ICD-10-CM | POA: Insufficient documentation

## 2019-10-28 DIAGNOSIS — E119 Type 2 diabetes mellitus without complications: Secondary | ICD-10-CM | POA: Diagnosis not present

## 2019-10-28 DIAGNOSIS — D123 Benign neoplasm of transverse colon: Secondary | ICD-10-CM | POA: Diagnosis not present

## 2019-10-28 DIAGNOSIS — Z86010 Personal history of colonic polyps: Secondary | ICD-10-CM | POA: Diagnosis not present

## 2019-10-28 DIAGNOSIS — C84A0 Cutaneous T-cell lymphoma, unspecified, unspecified site: Secondary | ICD-10-CM | POA: Insufficient documentation

## 2019-10-28 DIAGNOSIS — K640 First degree hemorrhoids: Secondary | ICD-10-CM | POA: Diagnosis not present

## 2019-10-28 DIAGNOSIS — Z87891 Personal history of nicotine dependence: Secondary | ICD-10-CM | POA: Diagnosis not present

## 2019-10-28 DIAGNOSIS — Z1211 Encounter for screening for malignant neoplasm of colon: Secondary | ICD-10-CM | POA: Diagnosis not present

## 2019-10-28 DIAGNOSIS — D124 Benign neoplasm of descending colon: Secondary | ICD-10-CM | POA: Diagnosis not present

## 2019-10-28 DIAGNOSIS — I10 Essential (primary) hypertension: Secondary | ICD-10-CM | POA: Insufficient documentation

## 2019-10-28 HISTORY — PX: COLONOSCOPY WITH PROPOFOL: SHX5780

## 2019-10-28 SURGERY — COLONOSCOPY WITH PROPOFOL
Anesthesia: General

## 2019-10-28 MED ORDER — PROPOFOL 500 MG/50ML IV EMUL
INTRAVENOUS | Status: AC
  Filled 2019-10-28: qty 50

## 2019-10-28 MED ORDER — PHENYLEPHRINE HCL (PRESSORS) 10 MG/ML IV SOLN
INTRAVENOUS | Status: DC | PRN
  Administered 2019-10-28: 100 ug via INTRAVENOUS

## 2019-10-28 MED ORDER — LIDOCAINE HCL (CARDIAC) PF 100 MG/5ML IV SOSY
PREFILLED_SYRINGE | INTRAVENOUS | Status: DC | PRN
  Administered 2019-10-28: 60 mg via INTRAVENOUS

## 2019-10-28 MED ORDER — PROPOFOL 10 MG/ML IV BOLUS
INTRAVENOUS | Status: AC
  Filled 2019-10-28: qty 20

## 2019-10-28 MED ORDER — PROPOFOL 10 MG/ML IV BOLUS
INTRAVENOUS | Status: DC | PRN
  Administered 2019-10-28: 5 mg via INTRAVENOUS
  Administered 2019-10-28: 50 mg via INTRAVENOUS

## 2019-10-28 MED ORDER — PROPOFOL 500 MG/50ML IV EMUL
INTRAVENOUS | Status: DC | PRN
  Administered 2019-10-28: 150 ug/kg/min via INTRAVENOUS

## 2019-10-28 MED ORDER — SODIUM CHLORIDE 0.9 % IV SOLN
INTRAVENOUS | Status: DC
  Administered 2019-10-28: 1000 mL via INTRAVENOUS

## 2019-10-28 MED ORDER — LIDOCAINE HCL (PF) 2 % IJ SOLN
INTRAMUSCULAR | Status: AC
  Filled 2019-10-28: qty 5

## 2019-10-28 NOTE — Op Note (Signed)
[facility] Gastroenterology Patient Name: Jose Randolph Procedure Date: 10/28/2019 12:56 PM MRN: [id number] Account #: [id number] Date of Birth: 11-05-1950 Admit Type: Outpatient Age: 69 Room: [facility] ENDO ROOM 3 Gender: Male Note Status: Finalized Procedure:             Colonoscopy Indications:           High risk colon cancer surveillance: Personal history                         of colonic polyps, Family history of colon cancer in a                         first-degree relative before age 71 years Providers:             Andrey Farmer MD, MD Referring MD:          Venetia Maxon. Elijio Miles, MD (Referring MD) Medicines:             Monitored Anesthesia Care Complications:         No immediate complications. Estimated blood loss:                         Minimal. Procedure:             Pre-Anesthesia Assessment:                        - Prior to the procedure, a History and Physical was                         performed, and patient medications and allergies were                         reviewed. The patient is competent. The risks and                         benefits of the procedure and the sedation options and                         risks were discussed with the patient. All questions                         were answered and informed consent was obtained.                         Patient identification and proposed procedure were                         verified by the physician, the nurse, the anesthetist                         and the technician in the endoscopy suite. Mental                         Status Examination: alert and oriented. Airway                         Examination: normal oropharyngeal airway and neck  mobility. Respiratory Examination: clear to                         auscultation. CV Examination: normal. Prophylactic                         Antibiotics: The patient does not require prophylactic                          antibiotics. Prior Anticoagulants: The patient has                         taken no previous anticoagulant or antiplatelet                         agents. ASA Grade Assessment: II - A patient with mild                         systemic disease. After reviewing the risks and                         benefits, the patient was deemed in satisfactory                         condition to undergo the procedure. The anesthesia                         plan was to use monitored anesthesia care (MAC).                         Immediately prior to administration of medications,                         the patient was re-assessed for adequacy to receive                         sedatives. The heart rate, respiratory rate, oxygen                         saturations, blood pressure, adequacy of pulmonary                         ventilation, and response to care were monitored                         throughout the procedure. The physical status of the                         patient was re-assessed after the procedure.                        After obtaining informed consent, the colonoscope was                         passed under direct vision. Throughout the procedure,                         the patient's blood pressure, pulse, and oxygen  saturations were monitored continuously. The                         Colonoscope was introduced through the anus and                         advanced to the the cecum, identified by appendiceal                         orifice and ileocecal valve. The colonoscopy was                         performed without difficulty. The patient tolerated                         the procedure well. The quality of the bowel                         preparation was adequate to identify polyps. Findings:      The perianal and digital rectal examinations were normal.      A 1 mm polyp was found in the cecum. The polyp was sessile. The polyp       was removed with a  jumbo cold forceps. Resection and retrieval were       complete. Estimated blood loss was minimal.      There was a small lipoma, in the ascending colon.      A 2 mm polyp was found in the hepatic flexure. The polyp was sessile.       The polyp was removed with a cold snare. Resection and retrieval were       complete. Estimated blood loss was minimal.      A 3 mm polyp was found in the descending colon. The polyp was sessile.       The polyp was removed with a cold snare. Resection and retrieval were       complete. Estimated blood loss was minimal.      A 1 mm polyp was found in the descending colon. The polyp was sessile.       The polyp was removed with a jumbo cold forceps. Resection and retrieval       were complete. Estimated blood loss was minimal.      Non-bleeding internal hemorrhoids were found during retroflexion. The       hemorrhoids were Grade I (internal hemorrhoids that do not prolapse).      The exam was otherwise without abnormality on direct and retroflexion       views. Impression:            - One 1 mm polyp in the cecum, removed with a jumbo                         cold forceps. Resected and retrieved.                        - Small lipoma in the ascending colon.                        - One 2 mm polyp at the hepatic flexure, removed with  a cold snare. Resected and retrieved.                        - One 3 mm polyp in the descending colon, removed with                         a cold snare. Resected and retrieved.                        - One 1 mm polyp in the descending colon, removed with                         a jumbo cold forceps. Resected and retrieved.                        - Non-bleeding internal hemorrhoids.                        - The examination was otherwise normal on direct and                         retroflexion views. Recommendation:        - Discharge patient to home.                        - Resume previous diet.                         - Continue present medications.                        - Await pathology results.                        - Repeat colonoscopy in 3 - 5 years for surveillance                         based on pathology results.                        - Return to referring physician as previously                         scheduled. Procedure Code(s):     --- Professional ---                        [phone], Colonoscopy, flexible; with removal of                         tumor(s), polyp(s), or other lesion(s) by snare                         technique                        03009, 62, Colonoscopy, flexible; with biopsy, single                         or multiple Diagnosis Code(s):     --- Professional ---  Z86.010, Personal history of colonic polyps                        K63.5, Polyp of colon                        D17.5, Benign lipomatous neoplasm of intra-abdominal                         organs                        K64.0, First degree hemorrhoids                        Z80.0, Family history of malignant neoplasm of                         digestive organs CPT copyright 2019 American Medical Association. All rights reserved. The codes documented in this report are preliminary and upon coder review may  be revised to meet current compliance requirements. Andrey Farmer, MD Andrey Farmer MD, MD 10/28/2019 1:36:12 PM Number of Addenda: 0 Note Initiated On: 10/28/2019 12:56 PM Scope Withdrawal Time: 0 hours 19 minutes 21 seconds  Total Procedure Duration: 0 hours 23 minutes 51 seconds  Estimated Blood Loss:  Estimated blood loss was minimal.      [facility]

## 2019-10-28 NOTE — Anesthesia Procedure Notes (Signed)
Date/Time: 10/28/2019 1:07 PM Performed by: Allean Found, CRNA Pre-anesthesia Checklist: Emergency Drugs available, Patient identified, Suction available, Patient being monitored and Timeout performed Placement Confirmation: positive ETCO2

## 2019-10-28 NOTE — Transfer of Care (Signed)
Immediate Anesthesia Transfer of Care Note  Patient: Jose Randolph  Procedure(s) Performed: COLONOSCOPY WITH PROPOFOL (N/A )  Patient Location: PACU  Anesthesia Type:General  Level of Consciousness: sedated  Airway & Oxygen Therapy: Patient Spontanous Breathing and Patient connected to nasal cannula oxygen  Post-op Assessment: Report given to RN and Post -op Vital signs reviewed and stable  Post vital signs: Reviewed and stable  Last Vitals:  Vitals Value Taken Time  BP 111/62 10/28/19 1337  Temp 36.1 C 10/28/19 1337  Pulse 79 10/28/19 1337  Resp 28 10/28/19 1337  SpO2 97 % 10/28/19 1337    Last Pain:  Vitals:   10/28/19 1337  TempSrc: Temporal  PainSc:          Complications: No complications documented.

## 2019-10-28 NOTE — Anesthesia Preprocedure Evaluation (Signed)
Anesthesia Evaluation  Patient identified by MRN, date of birth, ID band Patient awake    Reviewed: Allergy & Precautions, H&P , NPO status , Patient's Chart, lab work & pertinent test results, reviewed documented beta blocker date and time   Airway Mallampati: I  TM Distance: >3 FB Neck ROM: full    Dental  (+) Dental Advidsory Given, Partial Upper, Teeth Intact   Pulmonary neg pulmonary ROS, former smoker,    Pulmonary exam normal breath sounds clear to auscultation       Cardiovascular Exercise Tolerance: Good hypertension, (-) angina(-) Past MI and (-) Cardiac Stents Normal cardiovascular exam(-) dysrhythmias (-) Valvular Problems/Murmurs Rhythm:regular Rate:Normal     Neuro/Psych negative neurological ROS  negative psych ROS   GI/Hepatic negative GI ROS, Neg liver ROS,   Endo/Other  diabetes  Renal/GU negative Renal ROS  negative genitourinary   Musculoskeletal   Abdominal   Peds  Hematology negative hematology ROS (+)   Anesthesia Other Findings Past Medical History: No date: CTCL (cutaneous T-cell lymphoma) (HCC) No date: Diabetes mellitus without complication (HCC) No date: Hypertension 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 Excised, margin involved.   Reproductive/Obstetrics negative OB ROS                             Anesthesia Physical Anesthesia Plan  ASA: II  Anesthesia Plan: General   Post-op Pain Management:    Induction: Intravenous  PONV Risk Score and Plan: Propofol infusion and TIVA  Airway Management Planned: Natural Airway and Nasal Cannula  Additional Equipment:   Intra-op Plan:   Post-operative Plan:   Informed Consent: I have reviewed the patients History and Physical, chart, labs and discussed the procedure including the risks, benefits and alternatives for the proposed anesthesia with the patient or authorized  representative who has indicated his/her understanding and acceptance.     Dental Advisory Given  Plan Discussed with: Anesthesiologist, CRNA and Surgeon  Anesthesia Plan Comments:         Anesthesia Quick Evaluation

## 2019-10-28 NOTE — Interval H&P Note (Signed)
History and Physical Interval Note:  10/28/2019 12:46 PM  Jose Randolph  has presented today for surgery, with the diagnosis of HX COLON POLYP.  The various methods of treatment have been discussed with the patient and family. After consideration of risks, benefits and other options for treatment, the patient has consented to  Procedure(s): COLONOSCOPY WITH PROPOFOL (N/A) as a surgical intervention.  The patient's history has been reviewed, patient examined, no change in status, stable for surgery.  I have reviewed the patient's chart and labs.  Questions were answered to the patient's satisfaction.     [name]  Ok to proceed with colonoscopy

## 2019-10-28 NOTE — H&P (Signed)
Outpatient short stay form Pre-procedure 10/28/2019 12:42 PM Jose Miyamoto MD, MPH  Primary Physician: Dr. Elijio Miles  Reason for visit:  Family history of colon cancer  History of present illness:   69 y/o gentleman with history of high blood pressure and family history of colon cancer in his father at the age of 56. Occasional blood in his stool that started after a prostate biopsy several years ago. Last colonoscopy was a little over 5 years ago and unsure of results but they told him to come back in 5 years. No blood thinners and no abdominal surgeries.    Current Facility-Administered Medications:  .  0.9 %  sodium chloride infusion, , Intravenous, Continuous, Laura Radilla, Hilton Cork, MD, Last Rate: 20 mL/hr at 10/28/19 1227, 1,000 mL at 10/28/19 1227  Medications Prior to Admission  Medication Sig Dispense Refill Last Dose  . amLODipine (NORVASC) 10 MG tablet Take 10 mg by mouth daily.   10/27/2019 at Unknown time  . atorvastatin (LIPITOR) 40 MG tablet Take 40 mg by mouth daily.   10/27/2019 at Unknown time  . dapagliflozin propanediol (FARXIGA) 5 MG TABS tablet Take 5 mg by mouth daily.   10/27/2019 at Unknown time  . glipiZIDE-metformin (METAGLIP) 2.5-500 MG tablet Take 1 tablet by mouth 2 (two) times daily before a meal.   10/27/2019 at Unknown time  . irbesartan-hydrochlorothiazide (AVALIDE) 300-12.5 MG tablet Take 1 tablet by mouth daily.   10/27/2019 at Unknown time  . Mechlorethamine HCl (VALCHLOR) 0.016 % GEL Apply topically.   10/27/2019 at Unknown time  . olmesartan-hydrochlorothiazide (BENICAR HCT) 40-12.5 MG tablet Take 1 tablet by mouth daily.   10/27/2019 at Unknown time  . cyclobenzaprine (FLEXERIL) 10 MG tablet Take 10 mg by mouth 3 (three) times daily as needed for muscle spasms. (Patient not taking: Reported on 10/28/2019)   Not Taking at Unknown time  . gabapentin (NEURONTIN) 300 MG capsule Take 300 mg by mouth 3 (three) times daily. (Patient not taking: Reported on 10/28/2019)    Not Taking at Unknown time     Allergies  Allergen Reactions  . Oxycodone      Past Medical History:  Diagnosis Date  . 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 Diabetes mellitus without complication ([place])   . Hypertension   . 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 Excised, margin involved.    Review of systems:  Otherwise negative.    Physical Exam  Gen: Alert, oriented. Appears stated age.  HEENT: [place]/AT. PERRLA. Lungs: No respiratory distress Abd: soft, benign, no masses.  Ext: No edema.     Planned procedures: Proceed with colonoscopy. The patient understands the nature of the planned procedure, indications, risks, alternatives and potential complications including but not limited to bleeding, infection, perforation, damage to internal organs and possible oversedation/side effects from anesthesia. The patient agrees and gives consent to proceed.  Please refer to procedure notes for findings, recommendations and patient disposition/instructions.     Jose Miyamoto MD, MPH Gastroenterology 10/28/2019  12:42 PM

## 2019-10-29 ENCOUNTER — Encounter: Payer: Self-pay | Admitting: Gastroenterology

## 2019-10-29 LAB — SURGICAL PATHOLOGY

## 2019-10-29 NOTE — Progress Notes (Signed)
Patient unhappy with the delay or time of admittance to time of procedure. I instructed pt to ask for morning hours next procedure.Apoligized for delay.

## 2019-10-30 NOTE — Anesthesia Postprocedure Evaluation (Signed)
Anesthesia Post Note  Patient: Jose Randolph  Procedure(s) Performed: COLONOSCOPY WITH PROPOFOL (N/A )  Patient location during evaluation: PACU Anesthesia Type: General Level of consciousness: awake and alert Pain management: pain level controlled Vital Signs Assessment: post-procedure vital signs reviewed and stable Respiratory status: spontaneous breathing, nonlabored ventilation, respiratory function stable and patient connected to nasal cannula oxygen Cardiovascular status: blood pressure returned to baseline and stable Postop Assessment: no apparent nausea or vomiting Anesthetic complications: no   No complications documented.   Last Vitals:  Vitals:   10/28/19 1357 10/28/19 1407  BP: 131/83 133/81  Pulse: 69 63  Resp: (!) 28 19  Temp:    SpO2: 100% 100%    Last Pain:  Vitals:   10/29/19 0728  TempSrc:   PainSc: 0-No pain                 Molli Barrows

## 2019-11-04 ENCOUNTER — Ambulatory Visit (INDEPENDENT_AMBULATORY_CARE_PROVIDER_SITE_OTHER): Payer: Medicare Other

## 2019-11-04 ENCOUNTER — Other Ambulatory Visit: Payer: Self-pay

## 2019-11-04 DIAGNOSIS — C8400 Mycosis fungoides, unspecified site: Secondary | ICD-10-CM

## 2019-11-04 NOTE — Progress Notes (Signed)
Patient treated in the lightbox/NBUVB for 7 minutes and 42 seconds.   

## 2019-11-06 ENCOUNTER — Other Ambulatory Visit: Payer: Self-pay

## 2019-11-06 ENCOUNTER — Ambulatory Visit (INDEPENDENT_AMBULATORY_CARE_PROVIDER_SITE_OTHER): Payer: Medicare Other | Admitting: Dermatology

## 2019-11-06 DIAGNOSIS — C8400 Mycosis fungoides, unspecified site: Secondary | ICD-10-CM

## 2019-11-06 DIAGNOSIS — L821 Other seborrheic keratosis: Secondary | ICD-10-CM

## 2019-11-06 NOTE — Progress Notes (Deleted)
   Follow-Up Visit   Subjective  Jose Randolph is a 69 y.o. male who presents for the following: Follow-up (Mycosis Fungoides follow up - He saw Dr. Irish Elders 09/15/2019 and has a virtual follow up with her in October. She started Valchlor tid to left forearm and it seem to have made a light spot. He has Halobetasol oint. He is currently treating with NBUVB 1 time per week.).    The following portions of the chart were reviewed this encounter and updated as appropriate:      Review of Systems:  No other skin or systemic complaints except as noted in HPI or Assessment and Plan.  Objective  Well appearing patient in no apparent distress; mood and affect are within normal limits.    Objective  Trunk, extremities: 3.0 cm dyspigmented patch of left dorsum wrist    Assessment & Plan  Mycosis fungoides, unspecified body region ([place]) Trunk, extremities  Per Dr. Silver Huguenin note -  Assessment:  69 year old African American male with a history of MF/CTCL, diagnosed April 2014 ([id number] at that time), previously followed by [facility] [place] [facility] (Donne Hazel, MD). Current stage IA. Responded very well to acitretin, NB-UVB, and topical steroid. Off soriatane, still going twice a week for light (panel only) and stable but not clear. He did not continue with Valchlor due to some irritation but willing to try again. Current Stage IA   Plan:    1. Continue NB-UVB light box treatments but now only once a week at [facility] in [place]. We need to get you down to a maintenance regimen 2. May continue to use halobetasol ointment once or twice daily to all dark, raised, scaly areas. Don't use on face, armpits or groin. On left arm, only use on the days not putting on Valchlor 3. Will order Valchlor to apply again three times a week --this time to lesions only left arm. If it causes redness, back off frequency and only use if twice a week. Let me know if you cannot use it  at all. 4. Photos 5. Labs today: CBC with diff, CMP, LDH, flow 6. Televisit in 3 months if you can do a video visit October 19th 4 pm 7. RTC in 6 months Monday am at [facility]  Please do not hesitate to contact me through [place] with concerns related to your condition. You may also call [facility] at [phone], option 3.  I personally performed the service. (TP)  Jose Juluis Mire, MD   No follow-ups on file.

## 2019-11-06 NOTE — Progress Notes (Signed)
   Follow-Up Visit   Subjective  Jose Randolph is a 69 y.o. male who presents for the following: Follow-up (Mycosis Fungoides follow up - He saw Dr. Irish Elders 09/15/2019 and has a virtual follow up with her in October. She started Valchlor tid to left forearm and it seem to have made a light spot. He has Halobetasol oint. He is currently treating with NBUVB 1 time per week.). He also has some other spots he would like checked today.  The following portions of the chart were reviewed this encounter and updated as appropriate:  Tobacco  Allergies  Meds  Problems  Med Hx  Surg Hx  Fam Hx     Review of Systems:  No other skin or systemic complaints except as noted in HPI or Assessment and Plan.  Objective  Well appearing patient in no apparent distress; mood and affect are within normal limits.  A focused examination was performed including upper body and legs. Relevant physical exam findings are noted in the Assessment and Plan.  Objective  Trunk, extremities: 3.0 cm dyspigmented patch of left dorsum wrist        Assessment & Plan    Seborrheic Keratoses - Stuck-on, waxy, tan-brown papules and plaques  - Discussed benign etiology and prognosis. - Observe - Call for any changes   Mycosis fungoides /cutaneous T-cell lymphoma (CTCL) Trunk, extremities  Per Dr. Silver Huguenin note -  "Assessment:  69 year old African American male with a history of MF/CTCL, diagnosed April 2014 ([id number] at that time), previously followed by [facility] [place] [facility] (Donne Hazel, MD). Current stage IA. Responded very well to acitretin, NB-UVB, and topical steroid. Off soriatane, still going twice a week for light (panel only) and stable but not clear. He did not continue with Valchlor due to some irritation but willing to try again. Current Stage IA   Plan:    1. Continue NB-UVB light box treatments but now only once a week at [facility] in [place]. We need to get  you down to a maintenance regimen 2. May continue to use halobetasol ointment once or twice daily to all dark, raised, scaly areas. Don't use on face, armpits or groin. On left arm, only use on the days not putting on Valchlor 3. Will order Valchlor to apply again three times a week --this time to lesions only left arm. If it causes redness, back off frequency and only use if twice a week. Let me know if you cannot use it at all. 4. Photos 5. Labs today: CBC with diff, CMP, LDH, flow 6. Televisit in 3 months if you can do a video visit October 19th 4 pm 7. RTC in 6 months Monday am at [facility]  Please do not hesitate to contact me through [place] with concerns related to your condition. You may also call [facility] at [phone], option 3.  I personally performed the service. (TP)  Jose Juluis Mire, MD"  We will continue to support Dr. Daneil Dan Olson's recommendation for his CTCL here at [place] [facility] with narrowband ultraviolet B light treatments and periodic follow-up evaluations along with her visits at [facility] dermatology department. Return in about 6 months (around 05/05/2020).  I, Jose Randolph, CMA, am acting as scribe for Sarina Ser, MD .  Documentation: I have reviewed the above documentation for accuracy and completeness, and I agree with the above.  Sarina Ser, MD

## 2019-11-12 ENCOUNTER — Encounter: Payer: Self-pay | Admitting: Dermatology

## 2019-11-13 ENCOUNTER — Ambulatory Visit (INDEPENDENT_AMBULATORY_CARE_PROVIDER_SITE_OTHER): Payer: Medicare Other

## 2019-11-13 ENCOUNTER — Other Ambulatory Visit: Payer: Self-pay

## 2019-11-13 DIAGNOSIS — C8400 Mycosis fungoides, unspecified site: Secondary | ICD-10-CM | POA: Diagnosis not present

## 2019-11-13 NOTE — Progress Notes (Signed)
Patient treated in the lightbox/NBUVB for 7 minutes and 42 seconds.   

## 2019-11-18 ENCOUNTER — Other Ambulatory Visit: Payer: Self-pay

## 2019-11-18 ENCOUNTER — Ambulatory Visit (INDEPENDENT_AMBULATORY_CARE_PROVIDER_SITE_OTHER): Payer: Medicare Other

## 2019-11-18 DIAGNOSIS — C8400 Mycosis fungoides, unspecified site: Secondary | ICD-10-CM

## 2019-11-18 NOTE — Progress Notes (Signed)
Patient treated in the lightbox/NBUVB for 7 minutes and 42 seconds.   

## 2019-11-25 ENCOUNTER — Other Ambulatory Visit: Payer: Self-pay

## 2019-11-25 ENCOUNTER — Ambulatory Visit (INDEPENDENT_AMBULATORY_CARE_PROVIDER_SITE_OTHER): Payer: Medicare Other

## 2019-11-25 DIAGNOSIS — C8400 Mycosis fungoides, unspecified site: Secondary | ICD-10-CM | POA: Diagnosis not present

## 2019-11-25 NOTE — Progress Notes (Signed)
Patient treated in the lightbox/NBUVB for 7 minutes and 42 seconds.   

## 2019-12-02 ENCOUNTER — Other Ambulatory Visit: Payer: Self-pay

## 2019-12-02 ENCOUNTER — Ambulatory Visit (INDEPENDENT_AMBULATORY_CARE_PROVIDER_SITE_OTHER): Payer: Medicare Other

## 2019-12-02 DIAGNOSIS — C8400 Mycosis fungoides, unspecified site: Secondary | ICD-10-CM

## 2019-12-02 NOTE — Progress Notes (Signed)
Patient treated in the lightbox/NBUVB for 7 minutes and 42 seconds.   

## 2019-12-09 ENCOUNTER — Other Ambulatory Visit: Payer: Self-pay

## 2019-12-09 ENCOUNTER — Ambulatory Visit (INDEPENDENT_AMBULATORY_CARE_PROVIDER_SITE_OTHER): Payer: Medicare Other

## 2019-12-09 DIAGNOSIS — C8400 Mycosis fungoides, unspecified site: Secondary | ICD-10-CM

## 2019-12-09 NOTE — Progress Notes (Signed)
Patient treated in the lightbox/NBUVB for 7 minutes and 42 seconds.   

## 2019-12-11 ENCOUNTER — Ambulatory Visit (INDEPENDENT_AMBULATORY_CARE_PROVIDER_SITE_OTHER): Payer: Medicare Other

## 2019-12-11 ENCOUNTER — Other Ambulatory Visit: Payer: Self-pay

## 2019-12-11 DIAGNOSIS — C8400 Mycosis fungoides, unspecified site: Secondary | ICD-10-CM | POA: Diagnosis not present

## 2019-12-11 NOTE — Progress Notes (Signed)
Patient treated in the lightbox/NBUVB for 7 minutes and 42 seconds.   

## 2019-12-23 ENCOUNTER — Other Ambulatory Visit: Payer: Self-pay

## 2019-12-23 ENCOUNTER — Ambulatory Visit (INDEPENDENT_AMBULATORY_CARE_PROVIDER_SITE_OTHER): Payer: Medicare Other

## 2019-12-23 DIAGNOSIS — C8400 Mycosis fungoides, unspecified site: Secondary | ICD-10-CM | POA: Diagnosis not present

## 2019-12-23 NOTE — Progress Notes (Signed)
Patient treated in the lightbox/NBUVB for 7 minutes and 42 seconds.

## 2019-12-29 ENCOUNTER — Ambulatory Visit (INDEPENDENT_AMBULATORY_CARE_PROVIDER_SITE_OTHER): Payer: Medicare Other

## 2019-12-29 ENCOUNTER — Other Ambulatory Visit: Payer: Self-pay

## 2019-12-29 DIAGNOSIS — C8400 Mycosis fungoides, unspecified site: Secondary | ICD-10-CM | POA: Diagnosis not present

## 2019-12-29 NOTE — Progress Notes (Signed)
Patient treated in the lightbox/NBUVB for 7 minutes and 42 seconds.

## 2019-12-30 ENCOUNTER — Ambulatory Visit: Payer: Medicare Other

## 2020-01-06 ENCOUNTER — Ambulatory Visit (INDEPENDENT_AMBULATORY_CARE_PROVIDER_SITE_OTHER): Payer: Medicare Other

## 2020-01-06 ENCOUNTER — Other Ambulatory Visit: Payer: Self-pay

## 2020-01-06 DIAGNOSIS — C8400 Mycosis fungoides, unspecified site: Secondary | ICD-10-CM | POA: Diagnosis not present

## 2020-01-06 NOTE — Progress Notes (Signed)
Patient treated in the lightbox/NBUVB for 8 minutes and 06 seconds.

## 2020-01-13 ENCOUNTER — Ambulatory Visit (INDEPENDENT_AMBULATORY_CARE_PROVIDER_SITE_OTHER): Payer: Medicare Other

## 2020-01-13 ENCOUNTER — Other Ambulatory Visit: Payer: Self-pay

## 2020-01-13 DIAGNOSIS — C8400 Mycosis fungoides, unspecified site: Secondary | ICD-10-CM

## 2020-01-13 NOTE — Progress Notes (Signed)
Patient treated in the lightbox/NBUVB for 8 minutes and 06 seconds.

## 2020-01-20 ENCOUNTER — Ambulatory Visit (INDEPENDENT_AMBULATORY_CARE_PROVIDER_SITE_OTHER): Payer: Medicare Other

## 2020-01-20 ENCOUNTER — Other Ambulatory Visit: Payer: Self-pay

## 2020-01-20 DIAGNOSIS — C8400 Mycosis fungoides, unspecified site: Secondary | ICD-10-CM | POA: Diagnosis not present

## 2020-01-20 NOTE — Progress Notes (Signed)
Patient treated in the lightbox/[facility]BUVB for 8 minutes and 6 seconds.

## 2020-01-26 ENCOUNTER — Other Ambulatory Visit: Payer: Self-pay

## 2020-01-26 ENCOUNTER — Ambulatory Visit (INDEPENDENT_AMBULATORY_CARE_PROVIDER_SITE_OTHER): Payer: Medicare Other

## 2020-01-26 DIAGNOSIS — C8400 Mycosis fungoides, unspecified site: Secondary | ICD-10-CM

## 2020-01-26 NOTE — Progress Notes (Signed)
Patient treated in the lightbox/[facility]BUVB for 8 minutes and 6 seconds.

## 2020-01-27 ENCOUNTER — Ambulatory Visit: Payer: Medicare Other

## 2020-02-03 ENCOUNTER — Ambulatory Visit (INDEPENDENT_AMBULATORY_CARE_PROVIDER_SITE_OTHER): Payer: Medicare Other

## 2020-02-03 ENCOUNTER — Other Ambulatory Visit: Payer: Self-pay

## 2020-02-03 DIAGNOSIS — C8400 Mycosis fungoides, unspecified site: Secondary | ICD-10-CM | POA: Diagnosis not present

## 2020-02-03 NOTE — Progress Notes (Signed)
Patient treated in the lightbox/NBUVB for 8 minutes and 06 seconds.

## 2020-02-10 ENCOUNTER — Ambulatory Visit (INDEPENDENT_AMBULATORY_CARE_PROVIDER_SITE_OTHER): Payer: Medicare Other

## 2020-02-10 ENCOUNTER — Other Ambulatory Visit: Payer: Self-pay

## 2020-02-10 DIAGNOSIS — C8400 Mycosis fungoides, unspecified site: Secondary | ICD-10-CM | POA: Diagnosis not present

## 2020-02-10 NOTE — Progress Notes (Signed)
Patient treated in the lightbox/NBUVB for 8 minutes and 06 seconds.

## 2020-02-17 ENCOUNTER — Other Ambulatory Visit: Payer: Self-pay

## 2020-02-17 ENCOUNTER — Ambulatory Visit (INDEPENDENT_AMBULATORY_CARE_PROVIDER_SITE_OTHER): Payer: Medicare Other

## 2020-02-17 DIAGNOSIS — C8400 Mycosis fungoides, unspecified site: Secondary | ICD-10-CM | POA: Diagnosis not present

## 2020-02-17 NOTE — Progress Notes (Signed)
Patient treated in the lightbox/[facility]BUVB for 8 minutes and 6 seconds.

## 2020-02-24 ENCOUNTER — Ambulatory Visit: Payer: Medicare Other

## 2020-02-25 ENCOUNTER — Ambulatory Visit (INDEPENDENT_AMBULATORY_CARE_PROVIDER_SITE_OTHER): Payer: Medicare Other

## 2020-02-25 ENCOUNTER — Other Ambulatory Visit: Payer: Self-pay

## 2020-02-25 DIAGNOSIS — C8400 Mycosis fungoides, unspecified site: Secondary | ICD-10-CM

## 2020-02-25 NOTE — Progress Notes (Signed)
Patient treated in the lightbox/[facility]VB for 8 minutes and 6 seconds.   

## 2020-03-02 ENCOUNTER — Ambulatory Visit (INDEPENDENT_AMBULATORY_CARE_PROVIDER_SITE_OTHER): Payer: Medicare Other

## 2020-03-02 ENCOUNTER — Other Ambulatory Visit: Payer: Self-pay

## 2020-03-02 DIAGNOSIS — C8400 Mycosis fungoides, unspecified site: Secondary | ICD-10-CM | POA: Diagnosis not present

## 2020-03-02 NOTE — Progress Notes (Signed)
Patient treated in the lightbox/[facility]VB for 8 minutes and 6 seconds.   

## 2020-03-09 ENCOUNTER — Ambulatory Visit (INDEPENDENT_AMBULATORY_CARE_PROVIDER_SITE_OTHER): Payer: Medicare Other

## 2020-03-09 ENCOUNTER — Ambulatory Visit: Payer: Medicare Other

## 2020-03-09 ENCOUNTER — Other Ambulatory Visit: Payer: Self-pay

## 2020-03-09 DIAGNOSIS — C8400 Mycosis fungoides, unspecified site: Secondary | ICD-10-CM | POA: Diagnosis not present

## 2020-03-09 NOTE — Progress Notes (Signed)
Patient treated in the lightbox/[facility] for 8 minutes and 06 seconds.   

## 2020-03-15 ENCOUNTER — Other Ambulatory Visit: Payer: Self-pay

## 2020-03-15 ENCOUNTER — Ambulatory Visit (INDEPENDENT_AMBULATORY_CARE_PROVIDER_SITE_OTHER): Payer: Medicare Other | Admitting: Urology

## 2020-03-15 ENCOUNTER — Encounter: Payer: Self-pay | Admitting: Urology

## 2020-03-15 VITALS — BP 144/79 | HR 75 | Ht 70.0 in | Wt 231.5 lb

## 2020-03-15 DIAGNOSIS — R972 Elevated prostate specific antigen [PSA]: Secondary | ICD-10-CM

## 2020-03-15 NOTE — Progress Notes (Signed)
03/15/2020 1:01 PM   Jose Randolph 11-14-1950 [id number]  Referring provider: Jodi Marble, MD [place],  [place] [address]  Chief Complaint  Patient presents with   Establish Care    HPI: Jose Randolph is a 70 y.o. male referred for evaluation of an elevated PSA.   PSA drawn at PCP office 03/03/2020 elevated at 10.5  Prior PSA results as below  States he was evaluated by a urologist in [place], Dr. 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 Excised, margin involved.    Surgical History: Past Surgical History:  Procedure Laterality Date   COLONOSCOPY WITH PROPOFOL N/A 10/28/2019   Procedure: COLONOSCOPY WITH PROPOFOL;  Surgeon: [name], MD;  Location: [facility] ENDOSCOPY;  Service: Endoscopy;  Laterality: N/A;   FRACTURE SURGERY      Home Medications:  Allergies as of 03/15/2020      Reactions   Oxycodone    Bee Pollen Itching      Medication List       Accurate as of March 15, 2020  1:01 PM. If you have any questions, ask your nurse or doctor.        amLODipine 10 MG tablet Commonly known as: NORVASC Take 10 mg by mouth daily.   amLODipine-olmesartan 10-40 MG tablet Commonly known as: AZOR Take 1 tablet by mouth every morning.   atorvastatin 40 MG tablet Commonly known as: LIPITOR Take 40 mg by mouth daily.   carvedilol 12.5 MG tablet Commonly known as: COREG Take by mouth.   Cholecalciferol 25 MCG (1000 UT) tablet Take by mouth.    cyclobenzaprine 10 MG tablet Commonly known as: FLEXERIL Take 10 mg by mouth 3 (three) times daily as needed for muscle spasms.   Farxiga 5 MG Tabs tablet Generic drug: dapagliflozin propanediol Take 5 mg by mouth daily.   gabapentin 300 MG capsule Commonly known as: NEURONTIN Take 300 mg by mouth 3 (three) times daily.   glipiZIDE-metformin 2.5-500 MG tablet Commonly known as: METAGLIP Take 1 tablet by mouth 2 (two) times daily before a meal.   halobetasol 0.05 % ointment Commonly known as: ULTRAVATE Apply topically.   irbesartan-hydrochlorothiazide 300-12.5 MG tablet Commonly known as: AVALIDE Take 1 tablet by mouth daily.   metFORMIN 1000 MG tablet Commonly known as: GLUCOPHAGE Take 1,000 mg by mouth 2 (two) times daily.   Olmesartan-amLODIPine-HCTZ 40-10-12.5 MG Tabs Take 1 tablet by mouth every morning.   olmesartan-hydrochlorothiazide 40-12.5 MG tablet Commonly known as: BENICAR HCT Take 1 tablet by mouth daily.   onetouch ultrasoft lancets USE WITH DEVICE TO CHECK SUGARS TWICE DAILY DX E11.9   tadalafil 20 MG tablet Commonly known as: CIALIS EVERY 36 HOURS AS NEEDED FOR ED.   Valchlor 0.016 % Gel Generic drug: Mechlorethamine HCl Apply topically.       Allergies:  Allergies  Allergen Reactions   Oxycodone    Bee Pollen Itching    Family History: No family history on file.  Social History:  reports  that he has quit smoking. He has never used smokeless tobacco. He reports that he does not drink alcohol and does not use drugs.   Physical Exam: BP (!) 144/79 (BP Location: Left Arm, Patient Position: Sitting, Cuff Size: Large)    Pulse 75    Ht 5\' 10"  (1.778 m)    Wt 231 lb 8 oz (105 kg)    BMI 33.22 kg/m   Constitutional:  Alert and oriented, No acute distress. HEENT: [place] AT, moist mucus membranes.  Trachea midline, no masses. Cardiovascular: No clubbing, cyanosis, or edema. Respiratory: Normal respiratory effort, no increased work of  breathing. GI: Abdomen is soft, nontender, nondistended, no abdominal masses GU: Prostate 60 g, smooth without nodules or induration Neurologic: Grossly intact, no focal deficits, moving all 4 extremities. Psychiatric: Normal mood and affect.   Assessment & Plan:    1.  Elevated PSA  Although patient denies any previous history of prostate cancer his PCP records indicated diagnosis of prostate cancer.  It is possible this may have been low-grade prostate cancer that was being followed by active surveillance  I did not discover this information until after patient had left and will contact to sign a release to review his previous urology records  Most recent PSA is significantly above baseline  We will plan on repeating in late January and if still elevated above baseline obtain a prostate MRI  I spent 35 total minutes on the day of the encounter including pre-visit review of the medical record, face-to-face time with the patient, and post visit ordering of labs/imaging/tests.   Abbie Sons, [place] [address] [place], [place] [address] [phone]

## 2020-03-16 ENCOUNTER — Encounter: Payer: Self-pay | Admitting: Urology

## 2020-03-30 ENCOUNTER — Other Ambulatory Visit: Payer: Self-pay | Admitting: Family Medicine

## 2020-03-30 DIAGNOSIS — R972 Elevated prostate specific antigen [PSA]: Secondary | ICD-10-CM

## 2020-03-31 ENCOUNTER — Other Ambulatory Visit: Payer: Medicare Other

## 2020-03-31 ENCOUNTER — Other Ambulatory Visit: Payer: Self-pay

## 2020-03-31 DIAGNOSIS — R972 Elevated prostate specific antigen [PSA]: Secondary | ICD-10-CM

## 2020-04-01 ENCOUNTER — Telehealth: Payer: Self-pay | Admitting: *Deleted

## 2020-04-01 ENCOUNTER — Other Ambulatory Visit: Payer: Self-pay | Admitting: Urology

## 2020-04-01 DIAGNOSIS — R972 Elevated prostate specific antigen [PSA]: Secondary | ICD-10-CM

## 2020-04-01 DIAGNOSIS — N400 Enlarged prostate without lower urinary tract symptoms: Secondary | ICD-10-CM

## 2020-04-01 LAB — PSA: Prostate Specific Ag, Serum: 10.2 ng/mL — ABNORMAL HIGH (ref 0.0–4.0)

## 2020-04-01 NOTE — Progress Notes (Unsigned)
Order in for prostate mri and he will need an office visit with Dr.Stoioff for report.

## 2020-04-01 NOTE — Telephone Encounter (Signed)
-----   Message from Nori Riis, PA-C sent at 04/01/2020  8:17 AM EST ----- Please let Mr. Range know that his PSA remains elevated at 10.2 and per Dr. Dene Gentry last note, if he PSA remained elevated to get a prostate MRI.  If Mr. Mosco is agreeable, I will put in the order.

## 2020-04-01 NOTE — Telephone Encounter (Signed)
Notified patient as instructed, patient would like to get MRI prostate biopsy done

## 2020-04-13 ENCOUNTER — Other Ambulatory Visit: Payer: Self-pay

## 2020-04-13 ENCOUNTER — Ambulatory Visit
Admission: RE | Admit: 2020-04-13 | Discharge: 2020-04-13 | Disposition: A | Discharge status: Home / Self Care | Payer: Medicare Other | Source: Ambulatory Visit | Attending: Urology | Admitting: Urology

## 2020-04-13 DIAGNOSIS — R972 Elevated prostate specific antigen [PSA]: Secondary | ICD-10-CM | POA: Insufficient documentation

## 2020-04-13 DIAGNOSIS — N400 Enlarged prostate without lower urinary tract symptoms: Secondary | ICD-10-CM | POA: Insufficient documentation

## 2020-04-13 IMAGING — MR MR PROSTATE WO/W CM
56 series · 56 of 56 positions shown · IV contrast (10ml Gadavist)
Comparison: None.
COMPARISON: None.

Addendum:
CLINICAL DATA: Elevated PSA level. Reported biopsy 4 years ago,
results not provided.

EXAM:
MR PROSTATE WITHOUT AND WITH CONTRAST
TECHNIQUE: Multiplanar multisequence MRI images were obtained of the pelvis
centered about the prostate. Pre and post contrast images were
obtained.
CONTRAST:  10mL GADAVIST GADOBUTROL 1 MMOL/ML IV SOLN

[Series 3: ax in&out whole · axial · 6.0mm · 0.74mm/px · 1 of 35 slices shown (1 of 2)]
[im 1/35]
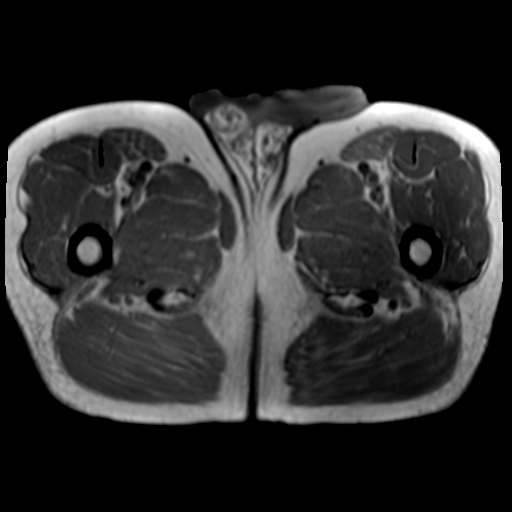

[Series 3: ax in&out whole · axial · 6.0mm · 0.74mm/px · 1 of 35 slices shown (2 of 2)]
[im 1/35]
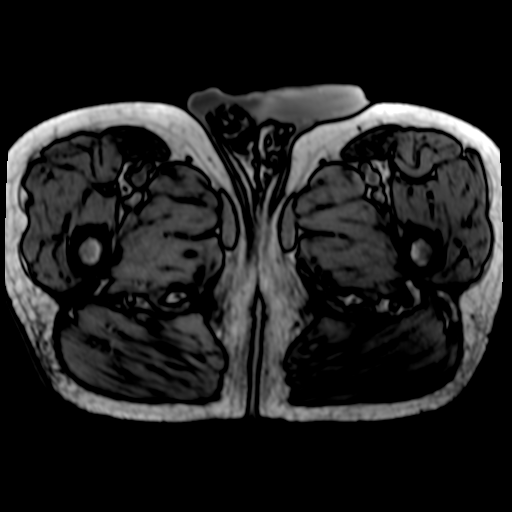

[Series 4: T2 · coronal · 3.0mm · 0.70mm/px · 1 of 37 slices shown (1 of 3)]
[im 1/37]
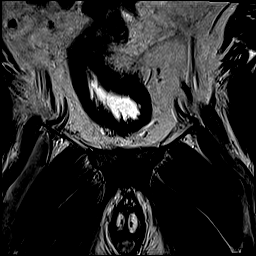

[Series 5: T2 · axial · 3.0mm · 0.56mm/px · 1 of 27 slices shown (2 of 3)]
[im 1/27]
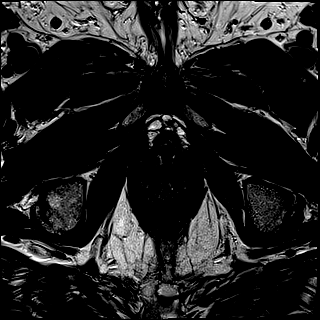

[Series 6: DWI · axial · 3.0mm · 0.86mm/px · 1 of 87 slices shown (1 of 3)]
[im 1/87]
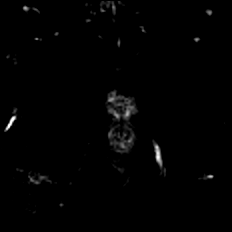

[Series 7: DWI · axial · 3.0mm · 0.86mm/px · 1 of 29 slices shown (2 of 3)]
[im 1/29]
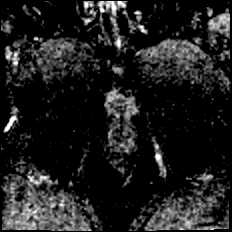

[Series 8: DWI · axial · 3.0mm · 0.86mm/px · 1 of 29 slices shown (3 of 3)]
[im 1/29]
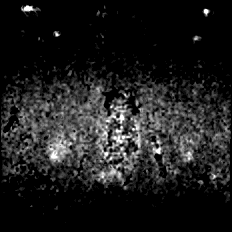

[Series 9: T2 · axial · 1.0mm · 1.04mm/px · 1 of 80 slices shown (3 of 3)]
[im 1/80]
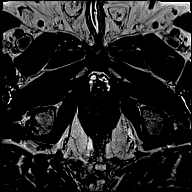

[Series 10: T1 · axial · 3.0mm · 1.15mm/px · 1 of 28 slices shown (1 of 48)]
[im 1/28]
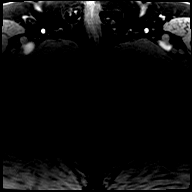

[Series 11: T1 · axial · 3.0mm · 1.15mm/px · 1 of 28 slices shown (2 of 48)]
[im 1/28]
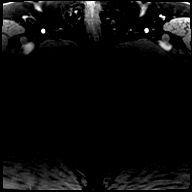

[Series 12: T1 · axial · 3.0mm · 1.15mm/px · 1 of 28 slices shown (3 of 48)]
[im 1/28]
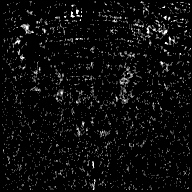

[Series 13: T1 · axial · 3.0mm · 1.15mm/px · 1 of 28 slices shown (4 of 48)]
[im 1/28]
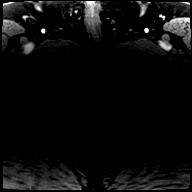

[Series 14: T1 · axial · 3.0mm · 1.15mm/px · 1 of 28 slices shown (5 of 48)]
[im 1/28]
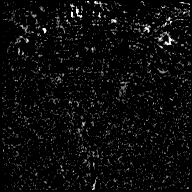

[Series 15: T1 · axial · 3.0mm · 1.15mm/px · 1 of 28 slices shown (6 of 48)]
[im 1/28]
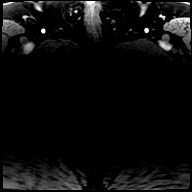

[Series 16: T1 · axial · 3.0mm · 1.15mm/px · 1 of 28 slices shown (7 of 48)]
[im 1/28]
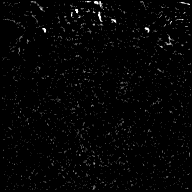

[Series 17: T1 · axial · 3.0mm · 1.15mm/px · 1 of 28 slices shown (8 of 48)]
[im 1/28]
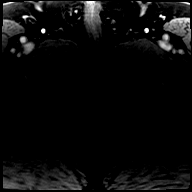

[Series 18: T1 · axial · 3.0mm · 1.15mm/px · 1 of 28 slices shown (9 of 48)]
[im 1/28]
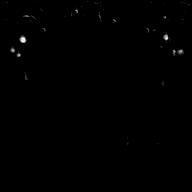

[Series 19: T1 · axial · 3.0mm · 1.15mm/px · 1 of 28 slices shown (10 of 48)]
[im 1/28]
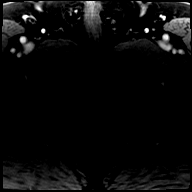

[Series 20: T1 · axial · 3.0mm · 1.15mm/px · 1 of 28 slices shown (11 of 48)]
[im 1/28]
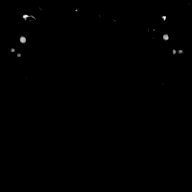

[Series 21: T1 · axial · 3.0mm · 1.15mm/px · 1 of 28 slices shown (12 of 48)]
[im 1/28]
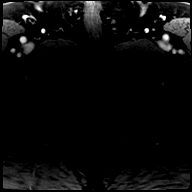

[Series 22: T1 · axial · 3.0mm · 1.15mm/px · 1 of 28 slices shown (13 of 48)]
[im 1/28]
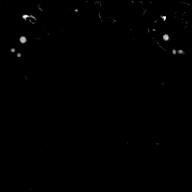

[Series 23: T1 · axial · 3.0mm · 1.15mm/px · 1 of 28 slices shown (14 of 48)]
[im 1/28]
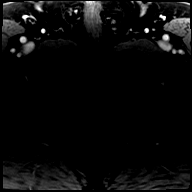

[Series 24: T1 · axial · 3.0mm · 1.15mm/px · 1 of 28 slices shown (15 of 48)]
[im 1/28]
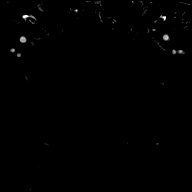

[Series 25: T1 · axial · 3.0mm · 1.15mm/px · 1 of 28 slices shown (16 of 48)]
[im 1/28]
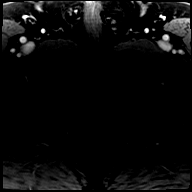

[Series 26: T1 · axial · 3.0mm · 1.15mm/px · 1 of 28 slices shown (17 of 48)]
[im 1/28]
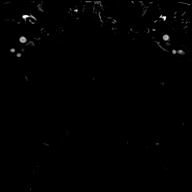

[Series 27: T1 · axial · 3.0mm · 1.15mm/px · 1 of 28 slices shown (18 of 48)]
[im 1/28]
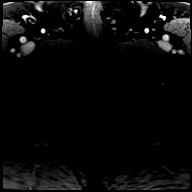

[Series 28: T1 · axial · 3.0mm · 1.15mm/px · 1 of 28 slices shown (19 of 48)]
[im 1/28]
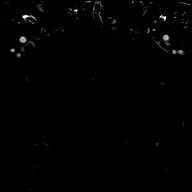

[Series 29: T1 · axial · 3.0mm · 1.15mm/px · 1 of 28 slices shown (20 of 48)]
[im 1/28]
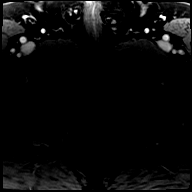

[Series 30: T1 · axial · 3.0mm · 1.15mm/px · 1 of 28 slices shown (21 of 48)]
[im 1/28]
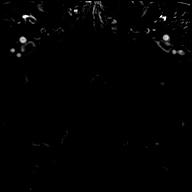

[Series 31: T1 · axial · 3.0mm · 1.15mm/px · 1 of 28 slices shown (22 of 48)]
[im 1/28]
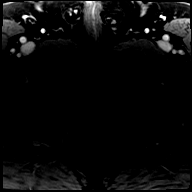

[Series 32: T1 · axial · 3.0mm · 1.15mm/px · 1 of 28 slices shown (23 of 48)]
[im 1/28]
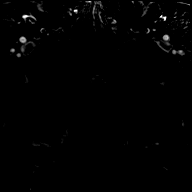

[Series 33: T1 · axial · 3.0mm · 1.15mm/px · 1 of 28 slices shown (24 of 48)]
[im 1/28]
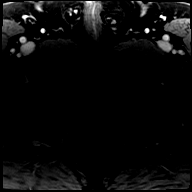

[Series 34: T1 · axial · 3.0mm · 1.15mm/px · 1 of 28 slices shown (25 of 48)]
[im 1/28]
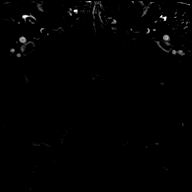

[Series 35: T1 · axial · 3.0mm · 1.15mm/px · 1 of 28 slices shown (26 of 48)]
[im 1/28]
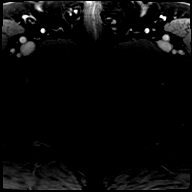

[Series 36: T1 · axial · 3.0mm · 1.15mm/px · 1 of 28 slices shown (27 of 48)]
[im 1/28]
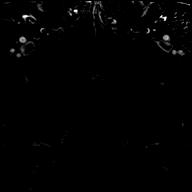

[Series 37: T1 · axial · 3.0mm · 1.15mm/px · 1 of 28 slices shown (28 of 48)]
[im 1/28]
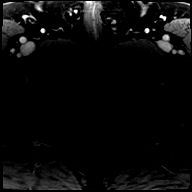

[Series 38: T1 · axial · 3.0mm · 1.15mm/px · 1 of 28 slices shown (29 of 48)]
[im 1/28]
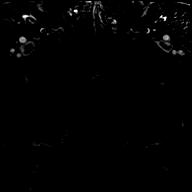

[Series 39: T1 · axial · 3.0mm · 1.15mm/px · 1 of 28 slices shown (30 of 48)]
[im 1/28]
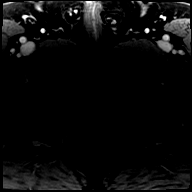

[Series 40: T1 · axial · 3.0mm · 1.15mm/px · 1 of 28 slices shown (31 of 48)]
[im 1/28]
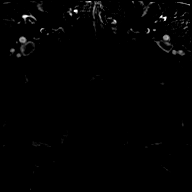

[Series 41: T1 · axial · 3.0mm · 1.15mm/px · 1 of 28 slices shown (32 of 48)]
[im 1/28]
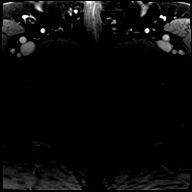

[Series 42: T1 · axial · 3.0mm · 1.15mm/px · 1 of 28 slices shown (33 of 48)]
[im 1/28]
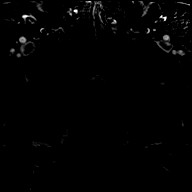

[Series 43: T1 · axial · 3.0mm · 1.15mm/px · 1 of 28 slices shown (34 of 48)]
[im 1/28]
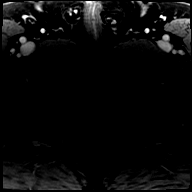

[Series 44: T1 · axial · 3.0mm · 1.15mm/px · 1 of 28 slices shown (35 of 48)]
[im 1/28]
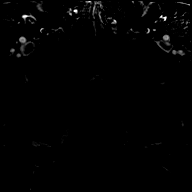

[Series 45: T1 · axial · 3.0mm · 1.15mm/px · 1 of 28 slices shown (36 of 48)]
[im 1/28]
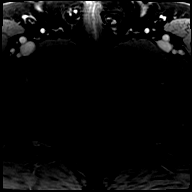

[Series 46: T1 · axial · 3.0mm · 1.15mm/px · 1 of 28 slices shown (37 of 48)]
[im 1/28]
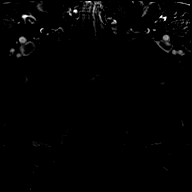

[Series 47: T1 · axial · 3.0mm · 1.15mm/px · 1 of 28 slices shown (38 of 48)]
[im 1/28]
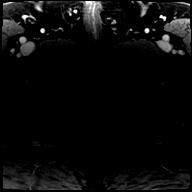

[Series 48: T1 · axial · 3.0mm · 1.15mm/px · 1 of 28 slices shown (39 of 48)]
[im 1/28]
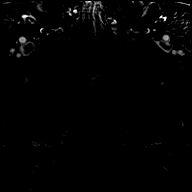

[Series 49: T1 · axial · 3.0mm · 1.15mm/px · 1 of 28 slices shown (40 of 48)]
[im 1/28]
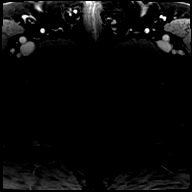

[Series 50: T1 · axial · 3.0mm · 1.15mm/px · 1 of 28 slices shown (41 of 48)]
[im 1/28]
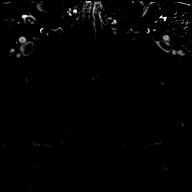

[Series 51: T1 · axial · 3.0mm · 1.15mm/px · 1 of 28 slices shown (42 of 48)]
[im 1/28]
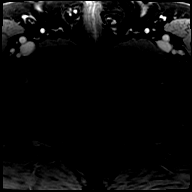

[Series 52: T1 · axial · 3.0mm · 1.15mm/px · 1 of 28 slices shown (43 of 48)]
[im 1/28]
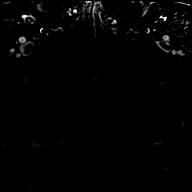

[Series 53: T1 · axial · 3.0mm · 1.15mm/px · 1 of 28 slices shown (44 of 48)]
[im 1/28]
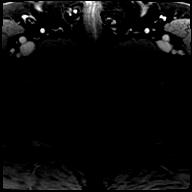

[Series 54: T1 · axial · 3.0mm · 1.15mm/px · 1 of 28 slices shown (45 of 48)]
[im 1/28]
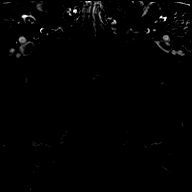

[Series 55: T1 · axial · 3.0mm · 1.15mm/px · 1 of 28 slices shown (46 of 48)]
[im 1/28]
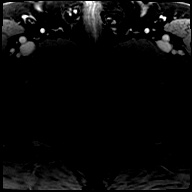

[Series 56: T1 · axial · 3.0mm · 1.15mm/px · 1 of 28 slices shown (47 of 48)]
[im 1/28]
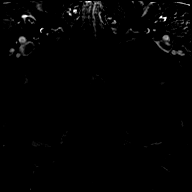

[Series 57: T1 · axial · 3.0mm · 1.15mm/px · 1 of 28 slices shown (48 of 48)]
[im 1/28]
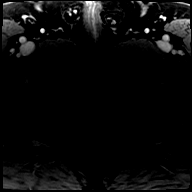

[56 of 56 positions shown; findings below may reference images not displayed]

FINDINGS: Prostate:

Region of interest # 1: PI-RADS category 4 lesion of the left
anterior peripheral zone in the mid gland and apex, with low T2
signal and focal early enhancement. This lesion measures 1.18 cubic
cm (1.7 by 1.0 by 1.3 cm) and is shown for example on image 17 of
series 5.

Region of interest # 2: PI-RADS category 3 lesion of the right
anterior and right posterolateral peripheral zone at the apex with
low T2 signal but no definite substantial focal early enhancement.
This lesion measures 0.81 cubic cm (1.8 by 0.9 by 1.0 cm) and is
shown for example on image 57 of series 9.

Encapsulated nodularity in the transition zones compatible with
benign prostatic hypertrophy.

Volume: 3D volumetric analysis: Prostate volume 102.85 cubic cm (6.3
by 6.3 by 5.7 cm).

Transcapsular spread:  Absent

Seminal vesicle involvement: Absent

Neurovascular bundle involvement: Absent

Pelvic adenopathy: Absent

Bone metastasis: Absent

Other findings: Degenerative facet arthropathy at L5-S1, left
greater than right.
IMPRESSION: 1. PI-RADS category 4 lesion of the left anterior peripheral zone in
the mid gland and apex.
2. PI-RADS category 3 lesion of the right anterior and
posterolateral peripheral zone at the apex.
3. Targeting data sent to [facility].
4. Benign prostatic hypertrophy, prostate volume 102.85 cubic cm.

ADDENDUM:
The original report was by Dr. Dalvin Billiot. The following
addendum is by Dr. Dalvin Billiot:

We were able to track down the biopsy results from 04/21/2015. The
report indicates Gleason 3+3=6 adenocarcinoma involving about 5
percent of a biopsy of the left mid gland. It is conceivable that
this could correspond with region of interest # 1.

*** End of Addendum ***
FINDINGS: Prostate:

Region of interest # 1: PI-RADS category 4 lesion of the left
anterior peripheral zone in the mid gland and apex, with low T2
signal and focal early enhancement. This lesion measures 1.18 cubic
cm (1.7 by 1.0 by 1.3 cm) and is shown for example on image 17 of
series 5.

Region of interest # 2: PI-RADS category 3 lesion of the right
anterior and right posterolateral peripheral zone at the apex with
low T2 signal but no definite substantial focal early enhancement.
This lesion measures 0.81 cubic cm (1.8 by 0.9 by 1.0 cm) and is
shown for example on image 57 of series 9.

Encapsulated nodularity in the transition zones compatible with
benign prostatic hypertrophy.

Volume: 3D volumetric analysis: Prostate volume 102.85 cubic cm (6.3
by 6.3 by 5.7 cm).

Transcapsular spread:  Absent

Seminal vesicle involvement: Absent

Neurovascular bundle involvement: Absent

Pelvic adenopathy: Absent

Bone metastasis: Absent

Other findings: Degenerative facet arthropathy at L5-S1, left
greater than right.
IMPRESSION: 1. PI-RADS category 4 lesion of the left anterior peripheral zone in
the mid gland and apex.
2. PI-RADS category 3 lesion of the right anterior and
posterolateral peripheral zone at the apex.
3. Targeting data sent to [facility].
4. Benign prostatic hypertrophy, prostate volume 102.85 cubic cm.

## 2020-04-13 MED ORDER — GADOBUTROL 1 MMOL/ML IV SOLN
10.0000 mL | Freq: Once | INTRAVENOUS | Status: AC | PRN
  Administered 2020-04-13: 10 mL via INTRAVENOUS

## 2020-04-15 ENCOUNTER — Other Ambulatory Visit: Payer: Self-pay

## 2020-04-15 ENCOUNTER — Encounter: Payer: Self-pay | Admitting: Urology

## 2020-04-15 ENCOUNTER — Ambulatory Visit (INDEPENDENT_AMBULATORY_CARE_PROVIDER_SITE_OTHER): Payer: Medicare Other | Admitting: Urology

## 2020-04-15 VITALS — BP 126/77 | HR 82 | Ht 70.0 in | Wt 230.0 lb

## 2020-04-15 DIAGNOSIS — R935 Abnormal findings on diagnostic imaging of other abdominal regions, including retroperitoneum: Secondary | ICD-10-CM

## 2020-04-15 DIAGNOSIS — C61 Malignant neoplasm of prostate: Secondary | ICD-10-CM | POA: Diagnosis not present

## 2020-04-15 NOTE — Progress Notes (Signed)
04/15/2020 2:18 PM   Jose Randolph [birth year] [id number]  Referring provider: Jodi Marble, MD [place],  [place] [address]  Chief Complaint  Patient presents with  . Elevated PSA    HPI: 70 y.o. male presents for follow-up of an elevated PSA   See my previous note 03/15/2020  Repeat PSA was 10.2  Prostate MRI performed 04/13/2020 showed a PI-RADS 4 lesion in the left anterior peripheral zone and a PI-RADS 3 lesion in the right anterior and right posterior lateral PZ  Prostate volume 103 cc  No evidence of extracapsular disease  Prior records were received and he underwent prostate biopsy 04/21/2015 which did show a focus of Gleason 3+3 adenocarcinoma in the left mid core involving 5% of the submitted tissue  He elected active surveillance  Follow-up prostate MRI June 2017 showed no suspicious lesions and a prostate volume of 79 cc  Follow-up confirmatory biopsy April 2018 with benign tissue and focus atypical glands suspicious for adenocarcinoma right base  PSA at that time was 4.99  PMH: Past Medical History:  Diagnosis Date  . 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 Diabetes mellitus without complication ([place])   . Hypertension   . 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 Excised, margin involved.    Surgical History: Past Surgical History:  Procedure Laterality Date  . COLONOSCOPY WITH PROPOFOL N/A 10/28/2019   Procedure: COLONOSCOPY WITH PROPOFOL;  Surgeon: [name], MD;  [facility] END[facility]COPY;  Service: Endoscopy;  Laterality: N/A;  . FRACTURE SURGERY      Home Medications:  Allergies as of 04/15/2020      Reactions   Oxycodone    Bee Pollen Itching      Medication List       Accurate as of April 15, 2020  2:18 PM. If you have any questions, ask your nurse or doctor.        amLODipine 10 MG tablet Commonly known as: NORVASC Take 10 mg by mouth daily.   amLODipine-olmesartan 10-40 MG  tablet Commonly known as: AZOR Take 1 tablet by mouth every morning.   atorvastatin 40 MG tablet Commonly known as: LIPITOR Take 40 mg by mouth daily.   carvedilol 12.5 MG tablet Commonly known as: COREG Take by mouth.   carvedilol 20 MG 24 hr capsule Commonly known as: COREG CR Take 20 mg by mouth daily.   Cholecalciferol 25 MCG (1000 UT) tablet Take by mouth.   CVS D3 125 MCG (5000 UT) capsule Generic drug: Cholecalciferol Take 5,000 Units by mouth daily.   dapagliflozin propanediol 5 MG Tabs tablet Commonly known as: FARXIGA Take 5 mg by mouth daily.   glipiZIDE-metformin 2.5-500 MG tablet Commonly known as: METAGLIP Take 1 tablet by mouth 2 (two) times daily before a meal.   halobetasol 0.05 % ointment Commonly known as: ULTRAVATE Apply topically.   irbesartan-hydrochlorothiazide 300-12.5 MG tablet Commonly known as: AVALIDE Take 1 tablet by mouth daily.   Mechlorethamine HCl 0.016 % Gel Apply topically.   metFORMIN 1000 MG tablet Commonly known as: GLUCOPHAGE Take 1,000 mg by mouth 2 (two) times daily.   Olmesartan-amLODIPine-HCTZ 40-10-12.5 MG Tabs Take 1 tablet by mouth every morning.   OneTouch Ultra test strip Generic drug: glucose blood 2 (two) times daily.   onetouch ultrasoft lancets USE WITH DEVICE TO CHECK SUGARS TWICE DAILY DX E11.9   tadalafil 20 MG tablet Commonly known as: CIALIS EVERY 36 HOURS AS NEEDED FOR ED.  Allergies:  Allergies  Allergen Reactions  . Oxycodone   . Bee Pollen Itching    Family History: History reviewed. No pertinent family history.  Social History:  reports that he quit smoking about 37 years ago. His smoking use included cigarettes. He has never used smokeless tobacco. He reports that he does not drink alcohol and does not use drugs.   Physical Exam: BP 126/77   Pulse 82   Ht 5\' 10"  (1.778 m)   Wt 230 lb (104.3 kg)   BMI 33.00 kg/m   Constitutional:  Alert and oriented, No acute  distress. HEENT: [place] AT, moist mucus membranes.  Trachea midline, no masses. Cardiovascular: No clubbing, cyanosis, or edema. Respiratory: Normal respiratory effort, no increased work of breathing.    Assessment & Plan:    1.  Prostate cancer  History of T1c adenocarcinoma prostate (low-grade) on active surveillance  Negative confirmatory biopsy/MRI 2018  Recent significant rise PSA 10.2 with repeat MRI showing PI-RADS 4 and PI-RADS 3 lesions  We discussed the possibility of a high-grade prostate cancer and I recommended scheduling MR fusion biopsy at [facility] in [place]  They were in agreement and would like to proceed  Referral entered and he will follow-up with me for the pathology report   Abbie Sons, [place] [address], [place], [place] [address] [phone]

## 2020-05-06 ENCOUNTER — Ambulatory Visit: Payer: Medicare Other | Admitting: Dermatology

## 2020-05-19 ENCOUNTER — Ambulatory Visit (INDEPENDENT_AMBULATORY_CARE_PROVIDER_SITE_OTHER): Payer: Medicare Other | Admitting: Urology

## 2020-05-19 ENCOUNTER — Other Ambulatory Visit: Payer: Self-pay

## 2020-05-19 ENCOUNTER — Encounter: Payer: Self-pay | Admitting: Urology

## 2020-05-19 VITALS — BP 122/70 | HR 81 | Ht 70.0 in | Wt 229.0 lb

## 2020-05-19 DIAGNOSIS — C61 Malignant neoplasm of prostate: Secondary | ICD-10-CM

## 2020-05-19 NOTE — Progress Notes (Signed)
05/19/2020 2:59 PM   Jose Randolph Apr 13, 1950 [id number]  Referring provider: Jodi Marble, MD Jose [place],  [place] [id number]  Chief Complaint  Patient presents with  . Prostate Cancer    HPI: 70 y.o. male presents for prostate biopsy follow-up.   History of low risk prostate cancer diagnosed in [place] in 2017 and was on active surveillance  Was not aware he had a diagnosis of prostate cancer  Was referred January 2022 for PSA 10  MRI 04/2020 with PI-RADS 4 lesion left anterior PZ and PI-RADS 3 lesion right anterior/right postero-lateral PZ  Underwent MR fusion biopsy [facility] 05/12/2020; prostate volume 72 g  ROI biopsies x7 taken all showing benign prostate tissue  LML core + Gleason 4+3 adenocarcinoma involving 15% of the submitted tissue  LAL core + Gleason 3+3 adenocarcinoma involving 5% of submitted tissue  Remaining biopsies with benign prostate tissue   PMH: Past Medical History:  Diagnosis Date  . 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 Diabetes mellitus without complication ([place])   . Hypertension   . 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 Excised, margin involved.    Surgical History: Past Surgical History:  Procedure Laterality Date  . COLONOSCOPY WITH PROPOFOL N/A 10/28/2019   Procedure: COLONOSCOPY WITH PROPOFOL;  Surgeon: Jose Rubenstein, MD;  Location: [facility] ENDOSCOPY;  Service: Endoscopy;  Laterality: N/A;  . FRACTURE SURGERY      Home Medications:  Allergies as of 05/19/2020      Reactions   Oxycodone    Bee Pollen Itching      Medication List       Accurate as of May 19, 2020  2:59 PM. If you have any questions, ask your nurse or doctor.        amLODipine 10 MG tablet Commonly known as: NORVASC Take 10 mg by mouth daily.   amLODipine-olmesartan 10-40 MG tablet Commonly known as: AZOR Take 1 tablet by mouth every morning.   atorvastatin 40 MG tablet Commonly known as:  LIPITOR Take 40 mg by mouth daily.   carvedilol 12.5 MG tablet Commonly known as: COREG Take by mouth.   carvedilol 20 MG 24 hr capsule Commonly known as: COREG CR Take 20 mg by mouth daily.   Cholecalciferol 25 MCG (1000 UT) tablet Take by mouth.   CVS D3 125 MCG (5000 UT) capsule Generic drug: Cholecalciferol Take 5,000 Units by mouth daily.   dapagliflozin propanediol 5 MG Tabs tablet Commonly known as: FARXIGA Take 5 mg by mouth daily.   glipiZIDE-metformin 2.5-500 MG tablet Commonly known as: METAGLIP Take 1 tablet by mouth 2 (two) times daily before a meal.   halobetasol 0.05 % ointment Commonly known as: ULTRAVATE Apply topically.   irbesartan-hydrochlorothiazide 300-12.5 MG tablet Commonly known as: AVALIDE Take 1 tablet by mouth daily.   Mechlorethamine HCl 0.016 % Gel Apply topically.   metFORMIN 1000 MG tablet Commonly known as: GLUCOPHAGE Take 1,000 mg by mouth 2 (two) times daily.   Olmesartan-amLODIPine-HCTZ 40-10-12.5 MG Tabs Take 1 tablet by mouth every morning.   OneTouch Ultra test strip Generic drug: glucose blood 2 (two) times daily.   onetouch ultrasoft lancets USE WITH DEVICE TO CHECK SUGARS TWICE DAILY DX E11.9   tadalafil 20 MG tablet Commonly known as: CIALIS EVERY 36 HOURS AS NEEDED FOR ED.       Allergies:  Allergies  Allergen Reactions  . Oxycodone   . Bee Pollen Itching    Family History:  History reviewed. No pertinent family history.  Social History:  reports that he quit smoking about 37 years ago. His smoking use included cigarettes. He has never used smokeless tobacco. He reports that he does not drink alcohol and does not use drugs.   Physical Exam: BP 122/70   Pulse 81   Ht 5\' 10"  (1.778 m)   Wt 229 lb (103.9 kg)   BMI 32.86 kg/m   Constitutional:  Alert and oriented, No acute distress. HEENT: [place] AT, moist mucus membranes.  Trachea midline, no masses. Cardiovascular: No clubbing, cyanosis, or  edema. Respiratory: Normal respiratory effort, no increased work of breathing.    Assessment & Plan:    1.  T1c adenocarcinoma prostate (NCCN intermediate risk-unfavorable)  The pathology report was discussed in detail with Jose Randolph and his wife  Based on age and intermediate risk disease would not recommend continued surveillance  We discussed curative treatment options of radical prostatectomy and radiation modalities  The pros and cons of each treatment were discussed including the most common side effects and potential complications  They were provided literature on prostate cancer diagnosis and treatment  They were offered a referral to radiation oncology  They would like to review the literature and discuss further before making a decision on treatment  I spent 30 total minutes on the day of the encounter including pre-visit review of the medical record, face-to-face time with the patient, and post visit ordering of labs/imaging/tests.    Jose Randolph, [place] [address], [place], [place] [address] [phone]

## 2020-05-20 ENCOUNTER — Encounter: Payer: Self-pay | Admitting: Urology

## 2020-05-20 ENCOUNTER — Other Ambulatory Visit: Payer: Self-pay | Admitting: Urology

## 2020-05-24 ENCOUNTER — Telehealth: Payer: Self-pay | Admitting: Urology

## 2020-05-24 DIAGNOSIS — C61 Malignant neoplasm of prostate: Secondary | ICD-10-CM

## 2020-05-24 NOTE — Telephone Encounter (Signed)
Pt wife called to inform you that the pt has decided to proceed with radiation. Jose Randolph would like to know what are the next steps and to please call her to schedule rad appts @ [phone].  Please advise.

## 2020-05-24 NOTE — Telephone Encounter (Signed)
Radiation oncology order was placed.  The [facility] will contact you regarding appointment

## 2020-05-24 NOTE — Telephone Encounter (Signed)
Notified patient as instructed,.  

## 2020-05-27 ENCOUNTER — Telehealth: Payer: Self-pay | Admitting: Urology

## 2020-05-27 ENCOUNTER — Encounter: Payer: Self-pay | Admitting: Radiation Oncology

## 2020-05-27 ENCOUNTER — Ambulatory Visit
Admission: RE | Admit: 2020-05-27 | Discharge: 2020-05-27 | Disposition: A | Discharge status: Home / Self Care | Payer: Medicare Other | Source: Ambulatory Visit | Attending: Radiation Oncology | Admitting: Radiation Oncology

## 2020-05-27 VITALS — BP 117/66 | HR 78 | Temp 96.6°F | Wt 233.0 lb

## 2020-05-27 DIAGNOSIS — C61 Malignant neoplasm of prostate: Secondary | ICD-10-CM

## 2020-05-27 NOTE — Telephone Encounter (Signed)
Appt for Gold Seed Markers scheduled Patient is aware and all instructions have been discussed all questions answered No PA required for the Eligard injection. Jose Randolph is aware  [name]

## 2020-05-27 NOTE — Consult Note (Signed)
NEW PATIENT EVALUATION  Name: Jose Randolph  MRN: [id number]  Date:   05/27/2020     DOB: May 26, 1950   This 70 y.o. 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  REFERRING PHYSICIAN: Jodi Marble, MD  CHIEF COMPLAINT:  Chief Complaint  Patient presents with  . Consult    DIAGNOSIS: The encounter diagnosis was Prostate cancer ([place]).   PREVIOUS INVESTIGATIONS:  MRI scan reviewed Clinical notes reviewed Pathology report reviewed  HPI: Patient is a 70 year old male diagnosed in 2017 with low risk prostate cancer Gleason 6 at the time.  His PSA had climbed to 10 was referred to urology where MRI showed a PI-RADS 4 lesion in the left anterior peripheral zone mid gland apex.  He also had a PI-RADS 3 lesion of the right anterior posterior lateral peripheral zone.  His prostate volume was approximately 103 cc.  He underwent MRI fusion biopsy showing 2 lesions from the left lateral gland 1A Gleason 6 the other a Gleason 7 (4+3).  He has been seen by urology and is now referred to ration collagen for opinion.  He is asymptomatic specifically denies urgency frequency or nocturia.  He is having no GI issues.  Overall in good health.  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    PAST SURGICAL HISTORY:  Past Surgical History:  Procedure Laterality Date  . COLONOSCOPY WITH PROPOFOL N/A 10/28/2019   Procedure: COLONOSCOPY WITH PROPOFOL;  Surgeon: [name], MD;  Location[facility] ENDOSCOPY;  Service: Endoscopy;  Laterality: N/A;  . FRACTURE SURGERY      FAMILY HISTORY: family history is not on file.  SOCIAL HISTORY:  reports that he quit smoking about 37 years ago. His  smoking use included cigarettes. He has never used smokeless tobacco. He reports that he does not drink alcohol and does not use drugs.  ALLERGIES: Oxycodone and Bee pollen  MEDICATIONS:  Current Outpatient Medications  Medication Sig Dispense Refill  . amLODipine (NORVASC) 10 MG tablet Take 10 mg by mouth daily.    Marland Kitchen amLODipine-olmesartan (AZOR) 10-40 MG tablet Take 1 tablet by mouth every morning.    Marland Kitchen atorvastatin (LIPITOR) 40 MG tablet Take 40 mg by mouth daily.    . carvedilol (COREG CR) 20 MG 24 hr capsule Take 20 mg by mouth daily.    . carvedilol (COREG) 12.5 MG tablet Take by mouth.    . Cholecalciferol 25 MCG (1000 UT) tablet Take by mouth.    . CVS D3 125 MCG (5000 UT) capsule Take 5,000 Units by mouth daily.    . dapagliflozin propanediol (FARXIGA) 5 MG TABS tablet Take 5 mg by mouth daily.    Marland Kitchen glipiZIDE-metformin (METAGLIP) 2.5-500 MG tablet Take 1 tablet by mouth 2 (two) times daily before a meal.    . halobetasol (ULTRAVATE) 0.05 % ointment Apply topically.    . irbesartan-hydrochlorothiazide (AVALIDE) 300-12.5 MG tablet Take 1 tablet by mouth daily.    . Lancets (ONETOUCH ULTRASOFT) lancets USE WITH DEVICE TO CHECK SUGARS TWICE DAILY DX E11.9    . Mechlorethamine HCl 0.016 % GEL Apply topically.    . metFORMIN (GLUCOPHAGE) 1000 MG tablet Take 1,000 mg by mouth 2 (two) times daily.    . Olmesartan-amLODIPine-HCTZ 40-10-12.5  MG TABS Take 1 tablet by mouth every morning.    Glory Rosebush ULTRA test strip 2 (two) times daily.    . tadalafil (CIALIS) 20 MG tablet EVERY 36 HOURS AS NEEDED FOR ED.     No current facility-administered medications for this encounter.    ECOG PERFORMANCE STATUS:  0 - Asymptomatic  REVIEW OF SYSTEMS: Patient denies any weight loss, fatigue, weakness, fever, chills or night sweats. Patient denies any loss of vision, blurred vision. Patient denies any ringing  of the ears or hearing loss. No irregular heartbeat. Patient denies heart murmur or history  of fainting. Patient denies any chest pain or pain radiating to her upper extremities. Patient denies any shortness of breath, difficulty breathing at night, cough or hemoptysis. Patient denies any swelling in the lower legs. Patient denies any nausea vomiting, vomiting of blood, or coffee ground material in the vomitus. Patient denies any stomach pain. Patient states has had normal bowel movements no significant constipation or diarrhea. Patient denies any dysuria, hematuria or significant nocturia. Patient denies any problems walking, swelling in the joints or loss of balance. Patient denies any skin changes, loss of hair or loss of weight. Patient denies any excessive worrying or anxiety or significant depression. Patient denies any problems with insomnia. Patient denies excessive thirst, polyuria, polydipsia. Patient denies any swollen glands, patient denies easy bruising or easy bleeding. Patient denies any recent infections, allergies or URI. Patient "s visual fields have not changed significantly in recent time.   PHYSICAL EXAM: BP 117/66   Pulse 78   Temp (!) 96.6 F (35.9 C) (Tympanic)   Wt 233 lb (105.7 kg)   BMI 33.43 kg/m  Well-developed well-nourished patient in NAD. HEENT reveals PERLA, EOMI, discs not visualized.  Oral cavity is clear. No oral mucosal lesions are identified. Neck is clear without evidence of cervical or supraclavicular adenopathy. Lungs are clear to A&P. Cardiac examination is essentially unremarkable with regular rate and rhythm without murmur rub or thrill. Abdomen is benign with no organomegaly or masses noted. Motor sensory and DTR levels are equal and symmetric in the upper and lower extremities. Cranial nerves II through XII are grossly intact. Proprioception is intact. No peripheral adenopathy or edema is identified. No motor or sensory levels are noted. Crude visual fields are within normal range.  LABORATORY DATA: Pathology report reviewed    RADIOLOGY RESULTS:  MRI reviewed compatible with above-stated findings   IMPRESSION: Stage IIa (T1 cN0 M0) Gleason 7 (4+3) adenocarcinoma the prostate in 70 year old male with a PSA of 10  PLAN: At this time of run the [facility] nomogram showing only an 8% chance of lymph node involvement.  I believe he would be a excellent candidate for either robotic prostatectomy or image guided IMRT radiation therapy.  Based on his prostate volume he is not a candidate for interstitial I-125 implant.  Risks and benefits of radiation including increased lower urinary tract symptoms fatigue alteration of blood counts diarrhea skin reaction all were reviewed with the patient and his wife.  They have opted to proceed with image guided radiation therapy.  I am asking Dr. Bernardo Heater to place fiducial markers in his prostate for daily image guided treatment.  I would also like the patient to start on 6 months of ADT therapy with Eligard and of asked Dr. Dene Gentry office to perform that also.  We will see him for CT simulation after his markers are placed.  Patient wife both comprehend my treatment plan well.  I  would like to take this opportunity to thank you for allowing me to participate in the care of your patient.Noreene Filbert, MD

## 2020-05-28 ENCOUNTER — Institutional Professional Consult (permissible substitution): Payer: Medicare Other | Admitting: Radiation Oncology

## 2020-06-01 ENCOUNTER — Telehealth: Payer: Self-pay

## 2020-06-01 NOTE — Telephone Encounter (Signed)
Medicare primary, NO PA required for Eligard injection.

## 2020-06-02 ENCOUNTER — Other Ambulatory Visit: Payer: Self-pay

## 2020-06-02 ENCOUNTER — Ambulatory Visit (INDEPENDENT_AMBULATORY_CARE_PROVIDER_SITE_OTHER): Payer: Medicare Other | Admitting: Urology

## 2020-06-02 ENCOUNTER — Encounter: Payer: Self-pay | Admitting: Urology

## 2020-06-02 VITALS — BP 125/74 | HR 81 | Ht 72.0 in | Wt 231.0 lb

## 2020-06-02 DIAGNOSIS — C61 Malignant neoplasm of prostate: Secondary | ICD-10-CM

## 2020-06-02 MED ORDER — LEUPROLIDE ACETATE (6 MONTH) 45 MG ~~LOC~~ KIT
45.0000 mg | PACK | Freq: Once | SUBCUTANEOUS | Status: AC
  Administered 2020-06-02: 45 mg via SUBCUTANEOUS

## 2020-06-02 MED ORDER — CEFTRIAXONE SODIUM 1 G IJ SOLR
1.0000 g | Freq: Once | INTRAMUSCULAR | Status: AC
Start: 2020-06-02 — End: 2020-06-02
  Administered 2020-06-02: 1 g via INTRAMUSCULAR

## 2020-06-03 NOTE — Progress Notes (Signed)
Eligard SubQ Injection   Due to Prostate Cancer patient is present today for a Eligard Injection.  Medication: Eligard 6 month Dose: 45 mg  Location: left  Lot: [id number] Exp: 09/2021  Patient tolerated well, no complications were noted  Performed by: Elberta Leatherwood, [place]  Per Dr. Bernardo Heater patient is to continue therapy for only this 1 injectio . Patient's next follow up was scheduled with Oncology. This appointment was scheduled using wheel and given to patient today along with reminder continue on Vitamin D 800-1000iu and Calium 1000-1200mg  daily while on Androgen Deprivation Therapy.  PA approval dates: No PA needed

## 2020-06-03 NOTE — Progress Notes (Signed)
06/03/20  CC: gold fiducial marker placement  HPI: 70 y.o. male with T1c intermediate risk unfavorable prostate cancer who presents today for placement of fiducial seed markers in anticipation of his upcoming IMRT with Dr. Baruch Gouty.  ADT for 6 months also recommended  Prostate Gold fiducial Marker Placement Procedure   Informed consent was obtained after discussing risks/benefits of the procedure.  A time out was performed to ensure correct patient identity.  Pre-Procedure: -Ceftriaxone 1 g IM   Procedure: - Lidocaine jelly was administered per rectum - Rectal ultrasound probe was placed without difficulty and the prostate visualized - Prostatic block performed with 10 mL 1% Xylocaine - 3 fiducial gold seed markers placed, one at right base, one at left base, one at apex of prostate gland under transrectal ultrasound guidance  Post-Procedure: - Patient tolerated the procedure well - He was counseled to seek immediate medical attention if experiences any severe pain, significant bleeding, or fevers    John Giovanni, MD

## 2020-06-07 ENCOUNTER — Ambulatory Visit
Admission: RE | Admit: 2020-06-07 | Discharge: 2020-06-07 | Disposition: A | Discharge status: Home / Self Care | Payer: Medicare Other | Source: Ambulatory Visit | Attending: Radiation Oncology | Admitting: Radiation Oncology

## 2020-06-07 DIAGNOSIS — C61 Malignant neoplasm of prostate: Secondary | ICD-10-CM | POA: Diagnosis present

## 2020-06-07 DIAGNOSIS — Z510 Encounter for antineoplastic radiation therapy: Secondary | ICD-10-CM | POA: Insufficient documentation

## 2020-06-14 DIAGNOSIS — Z510 Encounter for antineoplastic radiation therapy: Secondary | ICD-10-CM | POA: Diagnosis not present

## 2020-06-15 ENCOUNTER — Ambulatory Visit: Admission: RE | Admit: 2020-06-15 | Payer: Medicare Other | Source: Ambulatory Visit

## 2020-06-16 ENCOUNTER — Ambulatory Visit
Admission: RE | Admit: 2020-06-16 | Discharge: 2020-06-16 | Disposition: A | Discharge status: Home / Self Care | Payer: Medicare Other | Source: Ambulatory Visit | Attending: Radiation Oncology | Admitting: Radiation Oncology

## 2020-06-16 DIAGNOSIS — Z510 Encounter for antineoplastic radiation therapy: Secondary | ICD-10-CM | POA: Diagnosis not present

## 2020-06-17 ENCOUNTER — Ambulatory Visit
Admission: RE | Admit: 2020-06-17 | Discharge: 2020-06-17 | Disposition: A | Discharge status: Home / Self Care | Payer: Medicare Other | Source: Ambulatory Visit | Attending: Radiation Oncology | Admitting: Radiation Oncology

## 2020-06-17 DIAGNOSIS — Z510 Encounter for antineoplastic radiation therapy: Secondary | ICD-10-CM | POA: Diagnosis not present

## 2020-06-18 ENCOUNTER — Ambulatory Visit
Admission: RE | Admit: 2020-06-18 | Discharge: 2020-06-18 | Disposition: A | Discharge status: Home / Self Care | Payer: Medicare Other | Source: Ambulatory Visit | Attending: Radiation Oncology | Admitting: Radiation Oncology

## 2020-06-18 DIAGNOSIS — Z510 Encounter for antineoplastic radiation therapy: Secondary | ICD-10-CM | POA: Diagnosis not present

## 2020-06-21 ENCOUNTER — Ambulatory Visit
Admission: RE | Admit: 2020-06-21 | Discharge: 2020-06-21 | Disposition: A | Discharge status: Home / Self Care | Payer: Medicare Other | Source: Ambulatory Visit | Attending: Radiation Oncology | Admitting: Radiation Oncology

## 2020-06-21 DIAGNOSIS — Z510 Encounter for antineoplastic radiation therapy: Secondary | ICD-10-CM | POA: Diagnosis not present

## 2020-06-22 ENCOUNTER — Ambulatory Visit
Admission: RE | Admit: 2020-06-22 | Discharge: 2020-06-22 | Disposition: A | Discharge status: Home / Self Care | Payer: Medicare Other | Source: Ambulatory Visit | Attending: Radiation Oncology | Admitting: Radiation Oncology

## 2020-06-22 DIAGNOSIS — Z510 Encounter for antineoplastic radiation therapy: Secondary | ICD-10-CM | POA: Diagnosis not present

## 2020-06-23 ENCOUNTER — Ambulatory Visit
Admission: RE | Admit: 2020-06-23 | Discharge: 2020-06-23 | Disposition: A | Discharge status: Home / Self Care | Payer: Medicare Other | Source: Ambulatory Visit | Attending: Radiation Oncology | Admitting: Radiation Oncology

## 2020-06-23 DIAGNOSIS — Z510 Encounter for antineoplastic radiation therapy: Secondary | ICD-10-CM | POA: Diagnosis not present

## 2020-06-24 ENCOUNTER — Ambulatory Visit
Admission: RE | Admit: 2020-06-24 | Discharge: 2020-06-24 | Disposition: A | Discharge status: Home / Self Care | Payer: Medicare Other | Source: Ambulatory Visit | Attending: Radiation Oncology | Admitting: Radiation Oncology

## 2020-06-24 DIAGNOSIS — Z510 Encounter for antineoplastic radiation therapy: Secondary | ICD-10-CM | POA: Diagnosis not present

## 2020-06-25 ENCOUNTER — Ambulatory Visit
Admission: RE | Admit: 2020-06-25 | Discharge: 2020-06-25 | Disposition: A | Discharge status: Home / Self Care | Payer: Medicare Other | Source: Ambulatory Visit | Attending: Radiation Oncology | Admitting: Radiation Oncology

## 2020-06-25 DIAGNOSIS — Z510 Encounter for antineoplastic radiation therapy: Secondary | ICD-10-CM | POA: Diagnosis not present

## 2020-06-28 ENCOUNTER — Ambulatory Visit
Admission: RE | Admit: 2020-06-28 | Discharge: 2020-06-28 | Disposition: A | Discharge status: Home / Self Care | Payer: Medicare Other | Source: Ambulatory Visit | Attending: Radiation Oncology | Admitting: Radiation Oncology

## 2020-06-28 DIAGNOSIS — Z510 Encounter for antineoplastic radiation therapy: Secondary | ICD-10-CM | POA: Diagnosis not present

## 2020-06-29 ENCOUNTER — Ambulatory Visit
Admission: RE | Admit: 2020-06-29 | Discharge: 2020-06-29 | Disposition: A | Discharge status: Home / Self Care | Payer: Medicare Other | Source: Ambulatory Visit | Attending: Radiation Oncology | Admitting: Radiation Oncology

## 2020-06-29 DIAGNOSIS — Z510 Encounter for antineoplastic radiation therapy: Secondary | ICD-10-CM | POA: Diagnosis not present

## 2020-06-30 ENCOUNTER — Ambulatory Visit
Admission: RE | Admit: 2020-06-30 | Discharge: 2020-06-30 | Disposition: A | Discharge status: Home / Self Care | Payer: Medicare Other | Source: Ambulatory Visit | Attending: Radiation Oncology | Admitting: Radiation Oncology

## 2020-06-30 ENCOUNTER — Other Ambulatory Visit: Payer: Self-pay | Admitting: *Deleted

## 2020-06-30 DIAGNOSIS — Z510 Encounter for antineoplastic radiation therapy: Secondary | ICD-10-CM | POA: Diagnosis not present

## 2020-06-30 MED ORDER — TAMSULOSIN HCL 0.4 MG PO CAPS: 0.4000 mg | ORAL_CAPSULE | Freq: Every day | ORAL | 1 refills | Status: DC

## 2020-07-01 ENCOUNTER — Inpatient Hospital Stay: Payer: Medicare Other | Attending: Radiation Oncology

## 2020-07-01 ENCOUNTER — Other Ambulatory Visit: Payer: Self-pay

## 2020-07-01 ENCOUNTER — Ambulatory Visit
Admission: RE | Admit: 2020-07-01 | Discharge: 2020-07-01 | Disposition: A | Discharge status: Home / Self Care | Payer: Medicare Other | Source: Ambulatory Visit | Attending: Radiation Oncology | Admitting: Radiation Oncology

## 2020-07-01 ENCOUNTER — Other Ambulatory Visit: Payer: Self-pay | Admitting: *Deleted

## 2020-07-01 DIAGNOSIS — C61 Malignant neoplasm of prostate: Secondary | ICD-10-CM

## 2020-07-01 DIAGNOSIS — Z510 Encounter for antineoplastic radiation therapy: Secondary | ICD-10-CM | POA: Diagnosis not present

## 2020-07-01 LAB — CBC
HCT: 40.2 % (ref 39.0–52.0)
Hemoglobin: 13.5 g/dL (ref 13.0–17.0)
MCH: 30.2 pg (ref 26.0–34.0)
MCHC: 33.6 g/dL (ref 30.0–36.0)
MCV: 89.9 fL (ref 80.0–100.0)
Platelets: 215 10*3/uL (ref 150–400)
RBC: 4.47 MIL/uL (ref 4.22–5.81)
RDW: 12.9 % (ref 11.5–15.5)
WBC: 4.9 10*3/uL (ref 4.0–10.5)
nRBC: 0 % (ref 0.0–0.2)

## 2020-07-02 ENCOUNTER — Ambulatory Visit
Admission: RE | Admit: 2020-07-02 | Discharge: 2020-07-02 | Disposition: A | Discharge status: Home / Self Care | Payer: Medicare Other | Source: Ambulatory Visit | Attending: Radiation Oncology | Admitting: Radiation Oncology

## 2020-07-02 DIAGNOSIS — Z510 Encounter for antineoplastic radiation therapy: Secondary | ICD-10-CM | POA: Diagnosis not present

## 2020-07-05 ENCOUNTER — Ambulatory Visit
Admission: RE | Admit: 2020-07-05 | Discharge: 2020-07-05 | Disposition: A | Discharge status: Home / Self Care | Payer: Medicare Other | Source: Ambulatory Visit | Attending: Radiation Oncology | Admitting: Radiation Oncology

## 2020-07-05 DIAGNOSIS — Z510 Encounter for antineoplastic radiation therapy: Secondary | ICD-10-CM | POA: Insufficient documentation

## 2020-07-05 DIAGNOSIS — C61 Malignant neoplasm of prostate: Secondary | ICD-10-CM | POA: Diagnosis present

## 2020-07-06 ENCOUNTER — Ambulatory Visit
Admission: RE | Admit: 2020-07-06 | Discharge: 2020-07-06 | Disposition: A | Discharge status: Home / Self Care | Payer: Medicare Other | Source: Ambulatory Visit | Attending: Radiation Oncology | Admitting: Radiation Oncology

## 2020-07-06 DIAGNOSIS — Z510 Encounter for antineoplastic radiation therapy: Secondary | ICD-10-CM | POA: Diagnosis not present

## 2020-07-07 ENCOUNTER — Ambulatory Visit
Admission: RE | Admit: 2020-07-07 | Discharge: 2020-07-07 | Disposition: A | Discharge status: Home / Self Care | Payer: Medicare Other | Source: Ambulatory Visit | Attending: Radiation Oncology | Admitting: Radiation Oncology

## 2020-07-07 DIAGNOSIS — Z510 Encounter for antineoplastic radiation therapy: Secondary | ICD-10-CM | POA: Diagnosis not present

## 2020-07-08 ENCOUNTER — Ambulatory Visit
Admission: RE | Admit: 2020-07-08 | Discharge: 2020-07-08 | Disposition: A | Discharge status: Home / Self Care | Payer: Medicare Other | Source: Ambulatory Visit | Attending: Radiation Oncology | Admitting: Radiation Oncology

## 2020-07-08 DIAGNOSIS — Z510 Encounter for antineoplastic radiation therapy: Secondary | ICD-10-CM | POA: Diagnosis not present

## 2020-07-09 ENCOUNTER — Ambulatory Visit
Admission: RE | Admit: 2020-07-09 | Discharge: 2020-07-09 | Disposition: A | Discharge status: Home / Self Care | Payer: Medicare Other | Source: Ambulatory Visit | Attending: Radiation Oncology | Admitting: Radiation Oncology

## 2020-07-09 DIAGNOSIS — Z510 Encounter for antineoplastic radiation therapy: Secondary | ICD-10-CM | POA: Diagnosis not present

## 2020-07-12 ENCOUNTER — Ambulatory Visit
Admission: RE | Admit: 2020-07-12 | Discharge: 2020-07-12 | Disposition: A | Discharge status: Home / Self Care | Payer: Medicare Other | Source: Ambulatory Visit | Attending: Radiation Oncology | Admitting: Radiation Oncology

## 2020-07-12 DIAGNOSIS — Z510 Encounter for antineoplastic radiation therapy: Secondary | ICD-10-CM | POA: Diagnosis not present

## 2020-07-13 ENCOUNTER — Ambulatory Visit
Admission: RE | Admit: 2020-07-13 | Discharge: 2020-07-13 | Disposition: A | Discharge status: Home / Self Care | Payer: Medicare Other | Source: Ambulatory Visit | Attending: Radiation Oncology | Admitting: Radiation Oncology

## 2020-07-13 DIAGNOSIS — Z510 Encounter for antineoplastic radiation therapy: Secondary | ICD-10-CM | POA: Diagnosis not present

## 2020-07-14 ENCOUNTER — Ambulatory Visit
Admission: RE | Admit: 2020-07-14 | Discharge: 2020-07-14 | Disposition: A | Discharge status: Home / Self Care | Payer: Medicare Other | Source: Ambulatory Visit | Attending: Radiation Oncology | Admitting: Radiation Oncology

## 2020-07-14 ENCOUNTER — Inpatient Hospital Stay: Payer: Medicare Other

## 2020-07-14 DIAGNOSIS — Z510 Encounter for antineoplastic radiation therapy: Secondary | ICD-10-CM | POA: Diagnosis not present

## 2020-07-14 DIAGNOSIS — C61 Malignant neoplasm of prostate: Secondary | ICD-10-CM | POA: Insufficient documentation

## 2020-07-15 ENCOUNTER — Ambulatory Visit
Admission: RE | Admit: 2020-07-15 | Discharge: 2020-07-15 | Disposition: A | Discharge status: Home / Self Care | Payer: Medicare Other | Source: Ambulatory Visit | Attending: Radiation Oncology | Admitting: Radiation Oncology

## 2020-07-15 DIAGNOSIS — Z510 Encounter for antineoplastic radiation therapy: Secondary | ICD-10-CM | POA: Diagnosis not present

## 2020-07-16 ENCOUNTER — Ambulatory Visit
Admission: RE | Admit: 2020-07-16 | Discharge: 2020-07-16 | Disposition: A | Discharge status: Home / Self Care | Payer: Medicare Other | Source: Ambulatory Visit | Attending: Radiation Oncology | Admitting: Radiation Oncology

## 2020-07-16 DIAGNOSIS — Z510 Encounter for antineoplastic radiation therapy: Secondary | ICD-10-CM | POA: Diagnosis not present

## 2020-07-19 ENCOUNTER — Ambulatory Visit
Admission: RE | Admit: 2020-07-19 | Discharge: 2020-07-19 | Disposition: A | Discharge status: Home / Self Care | Payer: Medicare Other | Source: Ambulatory Visit | Attending: Radiation Oncology | Admitting: Radiation Oncology

## 2020-07-19 DIAGNOSIS — Z510 Encounter for antineoplastic radiation therapy: Secondary | ICD-10-CM | POA: Diagnosis not present

## 2020-07-20 ENCOUNTER — Ambulatory Visit
Admission: RE | Admit: 2020-07-20 | Discharge: 2020-07-20 | Disposition: A | Discharge status: Home / Self Care | Payer: Medicare Other | Source: Ambulatory Visit | Attending: Radiation Oncology | Admitting: Radiation Oncology

## 2020-07-20 DIAGNOSIS — Z510 Encounter for antineoplastic radiation therapy: Secondary | ICD-10-CM | POA: Diagnosis not present

## 2020-07-21 ENCOUNTER — Ambulatory Visit
Admission: RE | Admit: 2020-07-21 | Discharge: 2020-07-21 | Disposition: A | Discharge status: Home / Self Care | Payer: Medicare Other | Source: Ambulatory Visit | Attending: Radiation Oncology | Admitting: Radiation Oncology

## 2020-07-21 DIAGNOSIS — Z510 Encounter for antineoplastic radiation therapy: Secondary | ICD-10-CM | POA: Diagnosis not present

## 2020-07-22 ENCOUNTER — Other Ambulatory Visit: Payer: Self-pay | Admitting: Radiation Oncology

## 2020-07-22 ENCOUNTER — Ambulatory Visit
Admission: RE | Admit: 2020-07-22 | Discharge: 2020-07-22 | Disposition: A | Discharge status: Home / Self Care | Payer: Medicare Other | Source: Ambulatory Visit | Attending: Radiation Oncology | Admitting: Radiation Oncology

## 2020-07-22 DIAGNOSIS — Z510 Encounter for antineoplastic radiation therapy: Secondary | ICD-10-CM | POA: Diagnosis not present

## 2020-07-23 ENCOUNTER — Ambulatory Visit
Admission: RE | Admit: 2020-07-23 | Discharge: 2020-07-23 | Disposition: A | Discharge status: Home / Self Care | Payer: Medicare Other | Source: Ambulatory Visit | Attending: Radiation Oncology | Admitting: Radiation Oncology

## 2020-07-23 DIAGNOSIS — Z510 Encounter for antineoplastic radiation therapy: Secondary | ICD-10-CM | POA: Diagnosis not present

## 2020-07-26 ENCOUNTER — Ambulatory Visit
Admission: RE | Admit: 2020-07-26 | Discharge: 2020-07-26 | Disposition: A | Discharge status: Home / Self Care | Payer: Medicare Other | Source: Ambulatory Visit | Attending: Radiation Oncology | Admitting: Radiation Oncology

## 2020-07-26 DIAGNOSIS — Z510 Encounter for antineoplastic radiation therapy: Secondary | ICD-10-CM | POA: Diagnosis not present

## 2020-07-27 ENCOUNTER — Ambulatory Visit
Admission: RE | Admit: 2020-07-27 | Discharge: 2020-07-27 | Disposition: A | Discharge status: Home / Self Care | Payer: Medicare Other | Source: Ambulatory Visit | Attending: Radiation Oncology | Admitting: Radiation Oncology

## 2020-07-27 DIAGNOSIS — Z510 Encounter for antineoplastic radiation therapy: Secondary | ICD-10-CM | POA: Diagnosis not present

## 2020-07-28 ENCOUNTER — Inpatient Hospital Stay: Payer: Medicare Other

## 2020-07-28 ENCOUNTER — Ambulatory Visit
Admission: RE | Admit: 2020-07-28 | Discharge: 2020-07-28 | Disposition: A | Discharge status: Home / Self Care | Payer: Medicare Other | Source: Ambulatory Visit | Attending: Radiation Oncology | Admitting: Radiation Oncology

## 2020-07-28 DIAGNOSIS — Z510 Encounter for antineoplastic radiation therapy: Secondary | ICD-10-CM | POA: Diagnosis not present

## 2020-07-28 DIAGNOSIS — C61 Malignant neoplasm of prostate: Secondary | ICD-10-CM

## 2020-07-28 LAB — CBC
HCT: 36.3 % — ABNORMAL LOW (ref 39.0–52.0)
Hemoglobin: 12.5 g/dL — ABNORMAL LOW (ref 13.0–17.0)
MCH: 30.6 pg (ref 26.0–34.0)
MCHC: 34.4 g/dL (ref 30.0–36.0)
MCV: 89 fL (ref 80.0–100.0)
Platelets: 171 10*3/uL (ref 150–400)
RBC: 4.08 MIL/uL — ABNORMAL LOW (ref 4.22–5.81)
RDW: 13.2 % (ref 11.5–15.5)
WBC: 4.9 10*3/uL (ref 4.0–10.5)
nRBC: 0 % (ref 0.0–0.2)

## 2020-07-29 ENCOUNTER — Ambulatory Visit
Admission: RE | Admit: 2020-07-29 | Discharge: 2020-07-29 | Disposition: A | Discharge status: Home / Self Care | Payer: Medicare Other | Source: Ambulatory Visit | Attending: Radiation Oncology | Admitting: Radiation Oncology

## 2020-07-29 DIAGNOSIS — Z510 Encounter for antineoplastic radiation therapy: Secondary | ICD-10-CM | POA: Diagnosis not present

## 2020-07-30 ENCOUNTER — Ambulatory Visit
Admission: RE | Admit: 2020-07-30 | Discharge: 2020-07-30 | Disposition: A | Discharge status: Home / Self Care | Payer: Medicare Other | Source: Ambulatory Visit | Attending: Radiation Oncology | Admitting: Radiation Oncology

## 2020-07-30 DIAGNOSIS — Z510 Encounter for antineoplastic radiation therapy: Secondary | ICD-10-CM | POA: Diagnosis not present

## 2020-08-03 ENCOUNTER — Ambulatory Visit
Admission: RE | Admit: 2020-08-03 | Discharge: 2020-08-03 | Disposition: A | Discharge status: Home / Self Care | Payer: Medicare Other | Source: Ambulatory Visit | Attending: Radiation Oncology | Admitting: Radiation Oncology

## 2020-08-03 DIAGNOSIS — Z510 Encounter for antineoplastic radiation therapy: Secondary | ICD-10-CM | POA: Diagnosis not present

## 2020-08-04 ENCOUNTER — Ambulatory Visit
Admission: RE | Admit: 2020-08-04 | Discharge: 2020-08-04 | Disposition: A | Discharge status: Home / Self Care | Payer: Medicare Other | Source: Ambulatory Visit | Attending: Radiation Oncology | Admitting: Radiation Oncology

## 2020-08-04 DIAGNOSIS — Z510 Encounter for antineoplastic radiation therapy: Secondary | ICD-10-CM | POA: Diagnosis present

## 2020-08-04 DIAGNOSIS — C61 Malignant neoplasm of prostate: Secondary | ICD-10-CM | POA: Diagnosis present

## 2020-08-05 ENCOUNTER — Ambulatory Visit
Admission: RE | Admit: 2020-08-05 | Discharge: 2020-08-05 | Disposition: A | Discharge status: Home / Self Care | Payer: Medicare Other | Source: Ambulatory Visit | Attending: Radiation Oncology | Admitting: Radiation Oncology

## 2020-08-05 DIAGNOSIS — Z510 Encounter for antineoplastic radiation therapy: Secondary | ICD-10-CM | POA: Diagnosis not present

## 2020-08-06 ENCOUNTER — Ambulatory Visit
Admission: RE | Admit: 2020-08-06 | Discharge: 2020-08-06 | Disposition: A | Discharge status: Home / Self Care | Payer: Medicare Other | Source: Ambulatory Visit | Attending: Radiation Oncology | Admitting: Radiation Oncology

## 2020-08-06 DIAGNOSIS — Z510 Encounter for antineoplastic radiation therapy: Secondary | ICD-10-CM | POA: Diagnosis not present

## 2020-08-09 ENCOUNTER — Ambulatory Visit
Admission: RE | Admit: 2020-08-09 | Discharge: 2020-08-09 | Disposition: A | Discharge status: Home / Self Care | Payer: Medicare Other | Source: Ambulatory Visit | Attending: Radiation Oncology | Admitting: Radiation Oncology

## 2020-08-09 DIAGNOSIS — Z510 Encounter for antineoplastic radiation therapy: Secondary | ICD-10-CM | POA: Diagnosis not present

## 2020-08-10 ENCOUNTER — Ambulatory Visit
Admission: RE | Admit: 2020-08-10 | Discharge: 2020-08-10 | Disposition: A | Discharge status: Home / Self Care | Payer: Medicare Other | Source: Ambulatory Visit | Attending: Radiation Oncology | Admitting: Radiation Oncology

## 2020-08-10 DIAGNOSIS — Z510 Encounter for antineoplastic radiation therapy: Secondary | ICD-10-CM | POA: Diagnosis not present

## 2020-08-11 ENCOUNTER — Ambulatory Visit
Admission: RE | Admit: 2020-08-11 | Discharge: 2020-08-11 | Disposition: A | Discharge status: Home / Self Care | Payer: Medicare Other | Source: Ambulatory Visit | Attending: Radiation Oncology | Admitting: Radiation Oncology

## 2020-08-11 DIAGNOSIS — Z510 Encounter for antineoplastic radiation therapy: Secondary | ICD-10-CM | POA: Diagnosis not present

## 2020-08-18 ENCOUNTER — Other Ambulatory Visit: Payer: Self-pay | Admitting: Radiation Oncology

## 2020-09-13 ENCOUNTER — Encounter: Payer: Self-pay | Admitting: Radiation Oncology

## 2020-09-13 ENCOUNTER — Ambulatory Visit
Admission: RE | Admit: 2020-09-13 | Discharge: 2020-09-13 | Disposition: A | Discharge status: Home / Self Care | Payer: Medicare Other | Source: Ambulatory Visit | Attending: Radiation Oncology | Admitting: Radiation Oncology

## 2020-09-13 ENCOUNTER — Other Ambulatory Visit: Payer: Self-pay | Admitting: *Deleted

## 2020-09-13 ENCOUNTER — Other Ambulatory Visit: Payer: Self-pay

## 2020-09-13 VITALS — BP 140/70 | HR 72 | Temp 95.0°F | Resp 16 | Wt 235.1 lb

## 2020-09-13 DIAGNOSIS — Z923 Personal history of irradiation: Secondary | ICD-10-CM | POA: Diagnosis not present

## 2020-09-13 DIAGNOSIS — C61 Malignant neoplasm of prostate: Secondary | ICD-10-CM | POA: Diagnosis present

## 2020-09-13 NOTE — Progress Notes (Signed)
Radiation Oncology Follow up Note  Name: Jose Randolph   Date:   09/13/2020 MRN:  [id number] DOB: 1950/05/26    This 70 y.o. male presents to the clinic today for 1 month follow-up status post image guided IMRT radiation therapy for Gleason 7 (4+3) adenocarcinoma the prostate presenting with a PSA in the 10 range.  REFERRING PROVIDER: Jodi Marble, MD  HPI: Patient is a 70 year old male now out 1 month having completed IMRT image guided radiation therapy for Gleason 7 adenocarcinoma the prostate seen today in routine follow-up he is doing fairly well he is currently on Flomax although he states he is going to stop that soon since he does not think it is helping too much.  He has very little ER lower urinary tract symptoms no significant nocturia frequency or urgency.  His bowels are fine..  COMPLICATIONS OF TREATMENT: none  FOLLOW UP COMPLIANCE: keeps appointments   PHYSICAL EXAM:  BP 140/70 (BP Location: Left Arm, Patient Position: Sitting)   Pulse 72   Temp (!) 95 F (35 C)   Resp 16   Wt 235 lb 1.6 oz (106.6 kg)   BMI 31.89 kg/m  Well-developed well-nourished patient in NAD. HEENT reveals PERLA, EOMI, discs not visualized.  Oral cavity is clear. No oral mucosal lesions are identified. Neck is clear without evidence of cervical or supraclavicular adenopathy. Lungs are clear to A&P. Cardiac examination is essentially unremarkable with regular rate and rhythm without murmur rub or thrill. Abdomen is benign with no organomegaly or masses noted. Motor sensory and DTR levels are equal and symmetric in the upper and lower extremities. Cranial nerves II through XII are grossly intact. Proprioception is intact. No peripheral adenopathy or edema is identified. No motor or sensory levels are noted. Crude visual fields are within normal range.  RADIOLOGY RESULTS: No current films to review  PLAN: This time patient is doing well very low side effect profile from image guided IMRT radiation  therapy to his prostate.  I am pleased with his overall progress.  I will wait another 3 months and see him back in follow-up with a PSA at that time.  Patient knows to call in the meantime with any concerns.  I would like to take this opportunity to thank you for allowing me to participate in the care of your patient.Noreene Filbert, MD

## 2020-12-06 ENCOUNTER — Inpatient Hospital Stay: Payer: Medicare Other | Attending: Radiation Oncology

## 2020-12-06 DIAGNOSIS — C61 Malignant neoplasm of prostate: Secondary | ICD-10-CM | POA: Diagnosis not present

## 2020-12-06 LAB — PSA: Prostatic Specific Antigen: 0.11 ng/mL (ref 0.00–4.00)

## 2020-12-06 LAB — CBC
HCT: 37.8 % — ABNORMAL LOW (ref 39.0–52.0)
Hemoglobin: 12.8 g/dL — ABNORMAL LOW (ref 13.0–17.0)
MCH: 30.9 pg (ref 26.0–34.0)
MCHC: 33.9 g/dL (ref 30.0–36.0)
MCV: 91.3 fL (ref 80.0–100.0)
Platelets: 205 10*3/uL (ref 150–400)
RBC: 4.14 MIL/uL — ABNORMAL LOW (ref 4.22–5.81)
RDW: 13 % (ref 11.5–15.5)
WBC: 4.7 10*3/uL (ref 4.0–10.5)
nRBC: 0 % (ref 0.0–0.2)

## 2020-12-13 ENCOUNTER — Ambulatory Visit
Admission: RE | Admit: 2020-12-13 | Discharge: 2020-12-13 | Disposition: A | Discharge status: Home / Self Care | Payer: Medicare Other | Source: Ambulatory Visit | Attending: Radiation Oncology | Admitting: Radiation Oncology

## 2020-12-13 ENCOUNTER — Encounter: Payer: Self-pay | Admitting: Radiation Oncology

## 2020-12-13 ENCOUNTER — Other Ambulatory Visit: Payer: Self-pay

## 2020-12-13 VITALS — BP 144/76 | HR 77 | Temp 95.8°F | Resp 16 | Wt 232.7 lb

## 2020-12-13 DIAGNOSIS — Z923 Personal history of irradiation: Secondary | ICD-10-CM | POA: Diagnosis not present

## 2020-12-13 DIAGNOSIS — C61 Malignant neoplasm of prostate: Secondary | ICD-10-CM | POA: Diagnosis not present

## 2020-12-13 DIAGNOSIS — R232 Flushing: Secondary | ICD-10-CM | POA: Insufficient documentation

## 2020-12-13 DIAGNOSIS — R351 Nocturia: Secondary | ICD-10-CM | POA: Diagnosis not present

## 2020-12-13 NOTE — Progress Notes (Signed)
Radiation Oncology Follow up Note  Name: Jose Randolph   Date:   12/13/2020 MRN:  [id number] DOB: Apr 14, 1950    This 70 y.o. male presents to the clinic today for 47-month follow-up status post image guided IMRT radiation therapy for Gleason 7 (4+3) adenocarcinoma the prostate presenting with a PSA in the 10 range.Marland Kitchen  REFERRING PROVIDER: Jodi Marble, MD  HPI: Patient is a 70 year old male now out 4 months having completed image guided IMRT radiation therapy to his prostate for Gleason 7 adenocarcinoma.  Seen today in routine follow-up he is doing well.  He specifically denies significant increase in lower urinary tract symptoms has nocturia about 3.  His most recent PSA is 0.11.Marland Kitchen  COMPLICATIONS OF TREATMENT: none  FOLLOW UP COMPLIANCE: keeps appointments   PHYSICAL EXAM:  BP (!) 144/76 (BP Location: Right Arm, Patient Position: Sitting)   Pulse 77   Temp (!) 95.8 F (35.4 C) (Tympanic)   Resp 16   Wt 232 lb 11.2 oz (105.6 kg)   BMI 31.56 kg/m  Well-developed well-nourished patient in NAD. HEENT reveals PERLA, EOMI, discs not visualized.  Oral cavity is clear. No oral mucosal lesions are identified. Neck is clear without evidence of cervical or supraclavicular adenopathy. Lungs are clear to A&P. Cardiac examination is essentially unremarkable with regular rate and rhythm without murmur rub or thrill. Abdomen is benign with no organomegaly or masses noted. Motor sensory and DTR levels are equal and symmetric in the upper and lower extremities. Cranial nerves II through XII are grossly intact. Proprioception is intact. No peripheral adenopathy or edema is identified. No motor or sensory levels are noted. Crude visual fields are within normal range.  RADIOLOGY RESULTS: No current films to review  PLAN: At the present time patient is doing well with low side effect profile excellent response to his PSA.  He is having some hot flashes I have suggested vitamin E supplements.  This is  related to his initial Eligard treatment.  I have asked to see the patient back in 6 months for follow-up with a PSA at that time.  Patient knows to call with any concerns.  I would like to take this opportunity to thank you for allowing me to participate in the care of your patient.Noreene Filbert, MD

## 2021-06-06 ENCOUNTER — Inpatient Hospital Stay: Payer: Medicare Other | Attending: Radiation Oncology

## 2021-06-06 DIAGNOSIS — C61 Malignant neoplasm of prostate: Secondary | ICD-10-CM | POA: Diagnosis present

## 2021-06-06 LAB — PSA: Prostatic Specific Antigen: 0.74 ng/mL (ref 0.00–4.00)

## 2021-06-13 ENCOUNTER — Ambulatory Visit
Admission: RE | Admit: 2021-06-13 | Discharge: 2021-06-13 | Disposition: A | Discharge status: Home / Self Care | Payer: Medicare Other | Source: Ambulatory Visit | Attending: Radiation Oncology | Admitting: Radiation Oncology

## 2021-06-13 ENCOUNTER — Encounter: Payer: Self-pay | Admitting: Radiation Oncology

## 2021-06-13 VITALS — BP 148/76 | HR 73 | Temp 97.6°F | Resp 16 | Ht 70.0 in | Wt 227.5 lb

## 2021-06-13 DIAGNOSIS — Z923 Personal history of irradiation: Secondary | ICD-10-CM | POA: Insufficient documentation

## 2021-06-13 DIAGNOSIS — C61 Malignant neoplasm of prostate: Secondary | ICD-10-CM | POA: Diagnosis present

## 2021-06-13 NOTE — Progress Notes (Signed)
Radiation Oncology ?Follow up Note ? ?Name: Jose Randolph   ?Date:   06/13/2021 ?MRN:  [id number] ?DOB: 1951-02-25  ? ? ?This 71 y.o. male presents to the clinic today for 26-monthfollow-up status post image guided IMRT radiation therapy for Gleason 7 (4+3) adenocarcinoma the prostate presenting with a PSA in the 10 range. ? ?REFERRING PROVIDER: TJodi Marble MD ? ?HPI: Patient is a 71year old male now out 10 months having completed image guided IMRT radiation therapy for Gleason 7 adenocarcinoma the prostate seen today in routine follow-up he is doing well.  He states he has trouble sleeping but this is normal.  He is not having significant nocturia.  Very low side effect profile bowels are fine.  His most recent PSA.  Is slightly up at 0.74 although this because may be because his last 1 was under the influence of Eligard. ? ?COMPLICATIONS OF TREATMENT: none ? ?FOLLOW UP COMPLIANCE: keeps appointments  ? ?PHYSICAL EXAM:  ?BP (!) 148/76 (BP Location: Left Arm, Patient Position: Sitting)   Pulse 73   Temp 97.6 ?F (36.4 ?C) (Tympanic)   Resp 16   Ht '5\' 10"'$  (1.778 m)   Wt 227 lb 8 oz (103.2 kg)   BMI 32.64 kg/m?  ?Well-developed well-nourished patient in NAD. HEENT reveals PERLA, EOMI, discs not visualized.  Oral cavity is clear. No oral mucosal lesions are identified. Neck is clear without evidence of cervical or supraclavicular adenopathy. Lungs are clear to A&P. Cardiac examination is essentially unremarkable with regular rate and rhythm without murmur rub or thrill. Abdomen is benign with no organomegaly or masses noted. Motor sensory and DTR levels are equal and symmetric in the upper and lower extremities. Cranial nerves II through XII are grossly intact. Proprioception is intact. No peripheral adenopathy or edema is identified. No motor or sensory levels are noted. Crude visual fields are within normal range. ? ?RADIOLOGY RESULTS: No current films for review ? ?PLAN: Present time patient is doing  well.  Does have a slight checkup up in his PSA although I am that may be attributable to his first posttreatment PSA being under the influence of Eligard.  I wait another 6 months repeat his PSA should we see a gradual increase over time may refer him to medical oncology at that time for other options.  Patient with both comprehend my recommendations well. ? ?I would like to take this opportunity to thank you for allowing me to participate in the care of your patient.. ?  ? GNoreene Filbert MD ? ?

## 2021-10-26 ENCOUNTER — Ambulatory Visit (INDEPENDENT_AMBULATORY_CARE_PROVIDER_SITE_OTHER): Payer: Medicare Other | Admitting: Urology

## 2021-10-26 ENCOUNTER — Encounter: Payer: Self-pay | Admitting: Urology

## 2021-10-26 VITALS — BP 126/77 | HR 79 | Ht 70.0 in | Wt 220.0 lb

## 2021-10-26 DIAGNOSIS — Z8546 Personal history of malignant neoplasm of prostate: Secondary | ICD-10-CM

## 2021-10-26 DIAGNOSIS — R3911 Hesitancy of micturition: Secondary | ICD-10-CM | POA: Diagnosis not present

## 2021-10-26 DIAGNOSIS — N401 Enlarged prostate with lower urinary tract symptoms: Secondary | ICD-10-CM

## 2021-10-26 LAB — URINALYSIS, COMPLETE
Bilirubin, UA: NEGATIVE
Ketones, UA: NEGATIVE
Leukocytes,UA: NEGATIVE
Nitrite, UA: NEGATIVE
Protein,UA: NEGATIVE
RBC, UA: NEGATIVE
Specific Gravity, UA: <1.005 — ABNORMAL LOW (ref 1.005–1.030)
Urobilinogen, Ur: 0.2 mg/dL (ref 0.2–1.0)
pH, UA: 5 (ref 5.0–7.5)

## 2021-10-26 LAB — MICROSCOPIC EXAMINATION: Bacteria, UA: NONE SEEN

## 2021-10-26 LAB — BLADDER SCAN AMB NON-IMAGING: Scan Result: 0

## 2021-10-26 MED ORDER — TAMSULOSIN HCL 0.4 MG PO CAPS: 0.4000 mg | ORAL_CAPSULE | Freq: Two times a day (BID) | ORAL | 1 refills | Status: DC

## 2021-10-26 NOTE — Addendum Note (Signed)
Addended by: Chrystie Nose on: 10/26/2021 08:29 AM   Modules accepted: Orders

## 2021-10-26 NOTE — Progress Notes (Signed)
10/26/2021 8:07 AM   [name] [birth year] [id number]  Referring provider: Jodi Marble, MD [place],  [place] [address]  Chief Complaint  Patient presents with   Other    Urologic history: 1.  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  Erectile dysfunction  HPI: 71 y.o. male called for an acute visit for bothersome lower urinary tract symptoms.  Several month history of bothersome lower urinary tract symptoms including frequency, urgency, urinary hesitancy and a weak urinary stream Was taking tamsulosin at the time of his radiation however discontinued several months ago and has noted worsening lower urinary tract symptoms Denies dysuria or gross hematuria No flank, abdominal or pelvic pain   PMH: Past Medical History:  Diagnosis Date   CTCL (cutaneous T-cell lymphoma) ([facility])    Diabetes mellitus without complication ([place])    Hypertension    Spindle cell lipoma 01/21/2019   Right posterior neck. Excised, margin involved.    Surgical History: Past Surgical History:  Procedure Laterality Date   COLONOSCOPY WITH PROPOFOL N/A 10/28/2019   Procedure: COLONOSCOPY WITH PROPOFOL;  Surgeon: [name], MD;  Location: [facility] ENDOSCOPY;  Service: Endoscopy;  Laterality: N/A;   FRACTURE SURGERY      Home Medications:  Allergies as of 10/26/2021       Reactions   Oxycodone    Bee Pollen Itching        Medication List        Accurate as of October 26, 2021  8:07 AM. If you have any questions, ask your nurse or doctor.          amLODipine 10 MG  tablet Commonly known as: NORVASC Take 10 mg by mouth daily.   amLODipine-olmesartan 10-40 MG tablet Commonly known as: AZOR Take 1 tablet by mouth every morning.   atorvastatin 40 MG tablet Commonly known as: LIPITOR Take 40 mg by mouth daily.   carvedilol 12.5 MG tablet Commonly known as: COREG Take by mouth.   carvedilol 20 MG 24 hr capsule Commonly known as: COREG CR Take 20 mg by mouth daily.   Cholecalciferol 25 MCG (1000 UT) tablet Take by mouth.   CVS D3 125 MCG (5000 UT) capsule Generic drug: Cholecalciferol Take 5,000 Units by mouth daily.   dapagliflozin propanediol 5 MG Tabs tablet Commonly known as: FARXIGA Take 5 mg by mouth daily.   glipiZIDE-metformin 2.5-500 MG tablet Commonly known as: METAGLIP Take 1 tablet by mouth 2 (two) times daily before a meal.   halobetasol 0.05 % ointment Commonly known as: ULTRAVATE Apply topically.   irbesartan-hydrochlorothiazide 300-12.5 MG tablet Commonly known as: AVALIDE Take 1 tablet by mouth daily.   Mechlorethamine HCl 0.016 % Gel Apply topically.   metFORMIN 1000 MG tablet Commonly known as: GLUCOPHAGE Take 1,000 mg by mouth 2 (two) times daily.   Olmesartan-amLODIPine-HCTZ 40-10-12.5 MG Tabs Take 1 tablet by mouth every morning.   OneTouch Ultra test strip Generic drug: glucose blood 2 (two) times daily.   onetouch ultrasoft lancets USE WITH DEVICE TO CHECK SUGARS TWICE DAILY DX E11.9   tadalafil 20 MG tablet Commonly known as: CIALIS  EVERY 36 HOURS AS NEEDED FOR ED.   tamsulosin 0.4 MG Caps capsule Commonly known as: FLOMAX TAKE 1 CAPSULE (0.4 MG TOTAL) BY MOUTH DAILY AFTER SUPPER.        Allergies:  Allergies  Allergen Reactions   Oxycodone    Bee Pollen Itching    Family History: No family history on file.  Social History:  reports that he quit smoking about 38 years ago. His smoking use included cigarettes. He has never used smokeless tobacco. He reports that he does not drink  alcohol and does not use drugs.   Physical Exam: BP 126/77   Pulse 79   Ht '5\' 10"'$  (1.778 m)   Wt 220 lb (99.8 kg)   BMI 31.57 kg/m   Constitutional:  Alert and oriented, No acute distress. HEENT: [place] AT Respiratory: Normal respiratory effort, no increased work of breathing. Psychiatric: Normal mood and affect.   Assessment & Plan:    1.  Lower urinary tract symptoms Most likely secondary to residual BPH We discussed possibility of urethral stricture though less likely Restart tamsulosin 0.4 mg twice daily Follow-up 1 month for symptom recheck and if still symptomatic will perform cystoscopy at that visit He was unable to give a urine specimen today and estimated bladder volume by bladder scan was 0 mL.  He will attempt to void prior to leaving office and if not successful will bring a specimen in later today  2.  T1c intermediate risk prostate cancer PSA 06/06/2021 was 0.74   Abbie Sons, MD  [facility] [address], [place], [place] [address] [phone]

## 2021-11-17 ENCOUNTER — Other Ambulatory Visit: Payer: Self-pay | Admitting: Urology

## 2021-11-30 ENCOUNTER — Other Ambulatory Visit: Payer: Medicare Other | Admitting: Urology

## 2021-12-09 ENCOUNTER — Other Ambulatory Visit: Payer: Self-pay | Admitting: *Deleted

## 2021-12-09 DIAGNOSIS — C61 Malignant neoplasm of prostate: Secondary | ICD-10-CM

## 2021-12-12 ENCOUNTER — Encounter: Payer: Self-pay | Admitting: Urology

## 2021-12-12 ENCOUNTER — Ambulatory Visit (INDEPENDENT_AMBULATORY_CARE_PROVIDER_SITE_OTHER): Payer: Medicare Other | Admitting: Urology

## 2021-12-12 ENCOUNTER — Inpatient Hospital Stay: Payer: Medicare Other | Attending: Radiation Oncology

## 2021-12-12 VITALS — BP 130/80 | HR 74 | Ht 70.0 in | Wt 220.0 lb

## 2021-12-12 DIAGNOSIS — C61 Malignant neoplasm of prostate: Secondary | ICD-10-CM | POA: Diagnosis present

## 2021-12-12 DIAGNOSIS — Z87891 Personal history of nicotine dependence: Secondary | ICD-10-CM | POA: Diagnosis not present

## 2021-12-12 DIAGNOSIS — R6882 Decreased libido: Secondary | ICD-10-CM

## 2021-12-12 DIAGNOSIS — Z800 Family history of malignant neoplasm of digestive organs: Secondary | ICD-10-CM | POA: Insufficient documentation

## 2021-12-12 DIAGNOSIS — Z8042 Family history of malignant neoplasm of prostate: Secondary | ICD-10-CM | POA: Insufficient documentation

## 2021-12-12 DIAGNOSIS — N400 Enlarged prostate without lower urinary tract symptoms: Secondary | ICD-10-CM | POA: Diagnosis not present

## 2021-12-12 LAB — PSA: Prostatic Specific Antigen: 2.33 ng/mL (ref 0.00–4.00)

## 2021-12-12 MED ORDER — TAMSULOSIN HCL 0.4 MG PO CAPS: 0.4000 mg | ORAL_CAPSULE | Freq: Two times a day (BID) | ORAL | 3 refills | Status: DC

## 2021-12-12 NOTE — Progress Notes (Signed)
12/12/2021 9:06 AM   Jose Randolph 29-May-1950 [id number]  Referring provider: Jodi Marble, MD [place],  [place] [address]  Chief Complaint  Patient presents with   Other    Urologic history: 1.  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  Erectile dysfunction  HPI: 71 y.o. 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 Excised, margin involved.    Surgical History: Past Surgical History:  Procedure Laterality Date   COLONOSCOPY WITH PROPOFOL N/A 10/28/2019   Procedure: COLONOSCOPY WITH PROPOFOL;  Surgeon: [name], MD;  Location: [facility] ENDOSCOPY;  Service: Endoscopy;  Laterality: N/A;   FRACTURE SURGERY      Home Medications:  Allergies as of 12/12/2021       Reactions   Oxycodone    Bee Pollen Itching        Medication List        Accurate as of December 12, 2021  9:06 AM. If you have any questions, ask your nurse or doctor.          amLODipine 10 MG tablet Commonly known as: NORVASC Take 10 mg by mouth daily.   amLODipine-olmesartan 10-40 MG tablet Commonly known as: AZOR Take 1 tablet by mouth every morning.   atorvastatin 40 MG  tablet Commonly known as: LIPITOR Take 40 mg by mouth daily.   carvedilol 12.5 MG tablet Commonly known as: COREG Take by mouth.   carvedilol 20 MG 24 hr capsule Commonly known as: COREG CR Take 20 mg by mouth daily.   Cholecalciferol 25 MCG (1000 UT) tablet Take by mouth.   [employer] D3 125 MCG (5000 UT) capsule Generic drug: Cholecalciferol Take 5,000 Units by mouth daily.   dapagliflozin propanediol 5 MG Tabs tablet Commonly known as: FARXIGA Take 5 mg by mouth daily.   glipiZIDE-metformin 2.5-500 MG tablet Commonly known as: METAGLIP Take 1 tablet by mouth 2 (two) times daily before a meal.   halobetasol 0.05 % ointment Commonly known as: ULTRAVATE Apply topically.   irbesartan-hydrochlorothiazide 300-12.5 MG tablet Commonly known as: AVALIDE Take 1 tablet by mouth daily.   Mechlorethamine HCl 0.016 % Gel Apply topically.   metFORMIN 1000 MG tablet Commonly known as: GLUCOPHAGE Take 1,000 mg by mouth 2 (two) times daily.   Olmesartan-amLODIPine-HCTZ 40-10-12.5 MG Tabs Take 1 tablet by mouth every morning.   OneTouch Ultra test strip Generic drug: glucose blood 2 (two) times daily.   onetouch ultrasoft lancets USE WITH DEVICE TO CHECK SUGARS TWICE DAILY DX E11.9   tadalafil 20 MG tablet Commonly known as: CIALIS EVERY 36 HOURS AS NEEDED FOR ED.   tamsulosin 0.4 MG Caps capsule Commonly known as: FLOMAX TAKE 1 CAPSULE BY MOUTH 2 TIMES DAILY.  Allergies:  Allergies  Allergen Reactions   Oxycodone    Bee Pollen Itching    Family History: No family history on file.  Social History:  reports that he quit smoking about 38 years ago. His smoking use included cigarettes. He has never used smokeless tobacco. He reports that he does not drink alcohol and does not use drugs.   Physical Exam: BP 130/80   Pulse 74   Ht '5\' 10"'$  (1.778 m)   Wt 220 lb (99.8 kg)   BMI 31.57 kg/m   Constitutional:  Alert and oriented, No acute distress. HEENT: [place]  AT Respiratory: Normal respiratory effort, no increased work of breathing. Psychiatric: Normal mood and affect.   Assessment & Plan:    1.  Lower urinary tract symptoms Marked improvement on tamsulosin; refill sent to pharmacy Follow-up 1 year or earlier for worsening voiding symptoms  2.  T1c intermediate risk prostate cancer Scheduled to see radiation oncology next week  3.  Low libido Testosterone may not have recovered after leuprolide injection He is too soon out from prostate cancer treatment to consider TRT Check testosterone level at next follow-up if still symptomatic    Abbie Sons, MD  [facility] [address] [place], [place] [address] [phone]

## 2021-12-13 LAB — URINALYSIS, COMPLETE
Bilirubin, UA: NEGATIVE
Ketones, UA: NEGATIVE
Leukocytes,UA: NEGATIVE
Nitrite, UA: NEGATIVE
Protein,UA: NEGATIVE
RBC, UA: NEGATIVE
Specific Gravity, UA: <1.005 — ABNORMAL LOW (ref 1.005–1.030)
Urobilinogen, Ur: 1 mg/dL (ref 0.2–1.0)
pH, UA: 5 (ref 5.0–7.5)

## 2021-12-13 LAB — MICROSCOPIC EXAMINATION: Bacteria, UA: NONE SEEN

## 2021-12-19 ENCOUNTER — Other Ambulatory Visit: Payer: Self-pay | Admitting: *Deleted

## 2021-12-19 ENCOUNTER — Ambulatory Visit
Admission: RE | Admit: 2021-12-19 | Discharge: 2021-12-19 | Disposition: A | Discharge status: Home / Self Care | Payer: Medicare Other | Source: Ambulatory Visit | Attending: Radiation Oncology | Admitting: Radiation Oncology

## 2021-12-19 ENCOUNTER — Encounter: Payer: Self-pay | Admitting: Radiation Oncology

## 2021-12-19 ENCOUNTER — Ambulatory Visit: Payer: Medicare Other | Admitting: Radiation Oncology

## 2021-12-19 VITALS — BP 130/77 | HR 76 | Temp 97.3°F | Resp 20 | Ht 70.0 in | Wt 222.4 lb

## 2021-12-19 DIAGNOSIS — C61 Malignant neoplasm of prostate: Secondary | ICD-10-CM

## 2021-12-19 DIAGNOSIS — Z923 Personal history of irradiation: Secondary | ICD-10-CM | POA: Diagnosis not present

## 2021-12-19 NOTE — Progress Notes (Signed)
Radiation Oncology Follow up Note  Name: Jose Randolph   Date:   12/19/2021 MRN:  [id number] DOB: Jul 13, 1950    This 71 y.o. male presents to the clinic today for 25-monthfollow-up status post image guided IMRT radiation therapy for Gleason 7 (4+3) adenocarcinoma prostate presenting with a PSA in the 10 range.  REFERRING PROVIDER: TJodi Marble MD  HPI: Patient is a 71year old male now out 60 months having completed IMRT radiation therapy for Gleason 7 adenocarcinoma the prostate seen today in routine follow-up from a clinical standpoint he is doing well specifically denies any increased lower urinary tract fatigue.  Does have occasional loose stools does not take Imodium or follow any low residue diet.  His most recent PSA has climbed to 2.3 from 0.74 back in April 2023.  It was 0.1-year ago.  Patient initially had an MRI of his prostate no bone scan or PSMA PET.  COMPLICATIONS OF TREATMENT: none  FOLLOW UP COMPLIANCE: keeps appointments   PHYSICAL EXAM:  BP 130/77 (BP Location: Left Arm, Patient Position: Sitting, Cuff Size: Normal)   Pulse 76   Temp (!) 97.3 F (36.3 C) (Tympanic)   Resp 20   Ht '5\' 10"'$  (1.778 m) Comment: Stated HT  Wt 222 lb 6.4 oz (100.9 kg)   BMI 31.91 kg/m  Well-developed well-nourished patient in NAD. HEENT reveals PERLA, EOMI, discs not visualized.  Oral cavity is clear. No oral mucosal lesions are identified. Neck is clear without evidence of cervical or supraclavicular adenopathy. Lungs are clear to A&P. Cardiac examination is essentially unremarkable with regular rate and rhythm without murmur rub or thrill. Abdomen is benign with no organomegaly or masses noted. Motor sensory and DTR levels are equal and symmetric in the upper and lower extremities. Cranial nerves II through XII are grossly intact. Proprioception is intact. No peripheral adenopathy or edema is identified. No motor or sensory levels are noted. Crude visual fields are within normal  range.  RADIOLOGY RESULTS: PSMA PET scan ordered  PLAN: Present time patient clinically is doing well although he had a sharp jump in his PSA.  I am referring him to medical oncology for consideration of ADT therapy.  Of also ordering a PSMA PET scan just to rule out possibility of oligometastatic disease in his bone.  Patient comprehends my recommendations well.  I have asked to see him back in 6 months for follow-up if PSMA PET scan is positive we will get him back sooner for discussion.  I would like to take this opportunity to thank you for allowing me to participate in the care of your patient..Noreene Filbert MD

## 2021-12-20 ENCOUNTER — Inpatient Hospital Stay: Payer: Medicare Other

## 2021-12-20 ENCOUNTER — Inpatient Hospital Stay (HOSPITAL_BASED_OUTPATIENT_CLINIC_OR_DEPARTMENT_OTHER): Payer: Medicare Other | Admitting: Oncology

## 2021-12-20 ENCOUNTER — Encounter: Payer: Self-pay | Admitting: Oncology

## 2021-12-20 VITALS — BP 134/82 | HR 70 | Temp 97.6°F | Resp 18 | Ht 70.0 in | Wt 222.1 lb

## 2021-12-20 DIAGNOSIS — Z809 Family history of malignant neoplasm, unspecified: Secondary | ICD-10-CM | POA: Insufficient documentation

## 2021-12-20 DIAGNOSIS — Z87891 Personal history of nicotine dependence: Secondary | ICD-10-CM

## 2021-12-20 DIAGNOSIS — Z7189 Other specified counseling: Secondary | ICD-10-CM

## 2021-12-20 DIAGNOSIS — Z800 Family history of malignant neoplasm of digestive organs: Secondary | ICD-10-CM

## 2021-12-20 DIAGNOSIS — Z8042 Family history of malignant neoplasm of prostate: Secondary | ICD-10-CM

## 2021-12-20 DIAGNOSIS — C61 Malignant neoplasm of prostate: Secondary | ICD-10-CM | POA: Insufficient documentation

## 2021-12-20 NOTE — Progress Notes (Signed)
Hematology/Oncology Consult Note [phone] [phone]     REFERRING PROVIDER: Noreene Filbert, MD   Patient Care Team: Jodi Marble, MD as PCP - General (Internal Medicine)  CHIEF COMPLAINTS/PURPOSE OF CONSULTATION:  Prostate cancer  HISTORY OF PRESENTING ILLNESS:  Jose Randolph 71 y.o. male presents to establish care for  I have reviewed his chart and materials related to his cancer extensively and collaborated history with the patient. Summary of oncologic history is as follows: Oncology History  Prostate cancer ([facility])  04/21/2015 Cancer Staging   Staging form: Prostate, AJCC 8th Edition - Clinical stage from 04/21/2015: Stage IIB (cT1, cN0, cM0, PSA: 10, Grade Group: 2) - Signed by Earlie Server, MD on 12/20/2021 Stage prefix: Initial diagnosis Prostate specific antigen (PSA) range: 10 to 19 Gleason score: 7 Histologic grading system: 5 grade system   04/21/2015 Initial Diagnosis   Prostate cancer   -Per urologist who has obtained previous records, prostate biopsy 04/21/2015 which did show a focus of Gleason 3+3 adenocarcinoma in the left mid core involving 5% of the submitted tissue -Initiated patient elected active surveillance. -MRI prostate in 2017 showed no suspicious lesion in the prostate volume of 79 cc. -April 2018 biopsy showed benign tissue and a focus of atypical glands suspicious for adenocarcinoma right base.  PSA was elevated at 4.99     05/13/2018 Procedure   Per Urology note MR fusion biopsy [facility] 05/12/2020; prostate volume 72 g ROI biopsies x7 taken all showing benign prostate tissue LML core + Gleason 4+3 adenocarcinoma involving 15% of the submitted tissue LAL core + Gleason 3+3 adenocarcinoma involving 5% of submitted tissue Remaining biopsies with benign prostate tissue   03/31/2020 Tumor Marker   PSA 10.2.   04/14/2020 Imaging   MRI prostate with and without contrast -1. PI-RADS category 4 lesion of the left anterior  peripheral zone in the mid gland and apex. 2. PI-RADS category 3 lesion of the right anterior and posterolateral peripheral zone at the apex. 3. Targeting data sent to [facility]. 4. Benign prostatic hypertrophy, prostate volume 102.85 cubic cm   06/16/2020 - 08/11/2020 Radiation Therapy   Prostate radiation   12/06/2020 Tumor Marker   PSA 0.11   06/06/2021 Tumor Marker   PSA 0.74   12/12/2021 Tumor Marker   PSA  2.33.   12/27/2021 Imaging   PSMA PET scan pending    Patient was referred to establish care with medical oncology due to increase of PSA level. Patient was accompanied by wife.  Patient denies  urinary urgency, dysuria.  Occasionally patient has loose bowel movements, manageable with Imodium as needed.  Patient attributes to metformin use.  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 Jeannine Kitten and is currently on phototherapy.  Patient reported family history of prostate cancer and colon cancer.  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 Excised, margin involved.    SURGICAL HISTORY: Past Surgical History:  Procedure Laterality Date   COLONOSCOPY WITH PROPOFOL N/A 10/28/2019   Procedure: COLONOSCOPY WITH PROPOFOL;  Surgeon: [name], MD;  Location: [facility] ENDOSCOPY;  Service: Endoscopy;  Laterality: N/A;   FRACTURE SURGERY      SOCIAL HISTORY: Social History   Socioeconomic History   Marital status: Married    Spouse name: Not on  file   Number of children: Not on file   Years of education: Not on file   Highest education level: Not on file  Occupational History   Not on file  Tobacco Use   Smoking status: Former    Packs/day: 0.50    Years: 5.00    Total pack years: 2.50    Types: Cigarettes    Quit date: 03/07/1983    Years  since quitting: 38.8   Smokeless tobacco: Never  Vaping Use   Vaping Use: Never used  Substance and Sexual Activity   Alcohol use: Never   Drug use: Never   Sexual activity: Yes    Birth control/protection: None  Other Topics Concern   Not on file  Social History Narrative   Not on file   Social Determinants of Health   Financial Resource Strain: Not on file  Food Insecurity: Not on file  Transportation Needs: Not on file  Physical Activity: Not on file  Stress: Not on file  Social Connections: Not on file  Intimate Partner Violence: Not on file    FAMILY HISTORY: Family History  Problem Relation Age of Onset   Colon cancer Father    Prostate cancer Brother    Prostate cancer Brother     ALLERGIES:  is allergic to oxycodone and bee pollen.  MEDICATIONS:  Current Outpatient Medications  Medication Sig Dispense Refill   amLODipine-olmesartan (AZOR) 10-40 MG tablet Take 1 tablet by mouth every morning.     atorvastatin (LIPITOR) 40 MG tablet Take 40 mg by mouth daily.     carvedilol (COREG CR) 20 MG 24 hr capsule Take 20 mg by mouth daily.     Cholecalciferol 25 MCG (1000 UT) tablet Take by mouth.     dapagliflozin propanediol (FARXIGA) 5 MG TABS tablet Take 5 mg by mouth daily.     halobetasol (ULTRAVATE) 0.05 % ointment Apply topically.     irbesartan-hydrochlorothiazide (AVALIDE) 300-12.5 MG tablet Take 1 tablet by mouth daily.     Lancets (ONETOUCH ULTRASOFT) lancets USE WITH DEVICE TO CHECK SUGARS TWICE DAILY DX E11.9     metFORMIN (GLUCOPHAGE) 1000 MG tablet Take 1,000 mg by mouth 2 (two) times daily.     ONETOUCH ULTRA test strip 2 (two) times daily.     tadalafil (CIALIS) 20 MG tablet EVERY 36 HOURS AS NEEDED FOR ED.     tamsulosin (FLOMAX) 0.4 MG CAPS capsule Take 1 capsule (0.4 mg total) by mouth 2 (two) times daily. 180 capsule 3   No current facility-administered medications for this visit.    Review of Systems  Constitutional:  Negative for appetite  change, chills, fatigue, fever and unexpected weight change.  HENT:   Negative for hearing loss and voice change.   Eyes:  Negative for eye problems and icterus.  Respiratory:  Negative for chest tightness, cough and shortness of breath.   Cardiovascular:  Negative for chest pain and leg swelling.  Gastrointestinal:  Positive for diarrhea. Negative for abdominal distention and abdominal pain.  Endocrine: Negative for hot flashes.  Genitourinary:  Negative for difficulty urinating, dysuria and frequency.   Musculoskeletal:  Negative for arthralgias.  Skin:  Negative for itching and rash.  Neurological:  Negative for light-headedness and numbness.  Hematological:  Negative for adenopathy. Does not bruise/bleed easily.  Psychiatric/Behavioral:  Negative for confusion.      PHYSICAL EXAMINATION: ECOG PERFORMANCE STATUS: 1 - Symptomatic but completely ambulatory  Vitals:   12/20/21 0933  BP: 134/82  Pulse:  70  Resp: 18  Temp: 97.6 F (36.4 C)   Filed Weights   12/20/21 0933  Weight: 222 lb 1.6 oz (100.7 kg)    Physical Exam Constitutional:      General: He is not in acute distress.    Appearance: He is not diaphoretic.  HENT:     Head: Normocephalic and atraumatic.     Nose: Nose normal.     Mouth/Throat:     Pharynx: No oropharyngeal exudate.  Eyes:     General: No scleral icterus.    Pupils: Pupils are equal, round, and reactive to light.  Cardiovascular:     Rate and Rhythm: Normal rate and regular rhythm.     Heart sounds: No murmur heard. Pulmonary:     Effort: Pulmonary effort is normal. No respiratory distress.     Breath sounds: No rales.  Chest:     Chest wall: No tenderness.  Abdominal:     General: There is no distension.     Palpations: Abdomen is soft.     Tenderness: There is no abdominal tenderness.  Musculoskeletal:        General: Normal range of motion.     Cervical back: Normal range of motion and neck supple.  Skin:    General: Skin is warm  and dry.     Findings: No erythema.  Neurological:     Mental Status: He is alert and oriented to person, place, and time.     Cranial Nerves: No cranial nerve deficit.     Motor: No abnormal muscle tone.     Coordination: Coordination normal.  Psychiatric:        Mood and Affect: Affect normal.      LABORATORY DATA:  I have reviewed the data as listed    Latest Ref Rng & Units 12/06/2020    7:46 AM 07/28/2020    8:18 AM 07/01/2020    8:18 AM  CBC  WBC 4.0 - 10.5 K/uL 4.7  4.9  4.9   Hemoglobin 13.0 - 17.0 g/dL 12.8  12.5  13.5   Hematocrit 39.0 - 52.0 % 37.8  36.3  40.2   Platelets 150 - 400 K/uL 205  171  215        No data to display           RADIOGRAPHIC STUDIES: I have personally reviewed the radiological images as listed and agreed with the findings in the report. No results found.  ASSESSMENT & PLAN:   Cancer Staging  Prostate cancer [facility]) Staging form: Prostate, AJCC 8th Edition - Clinical stage from 04/21/2015: Stage IIB (cT1, cN0, cM0, PSA: 10, Grade Group: 2) - Signed by Earlie Server, MD on 12/20/2021   Prostate cancer [facility]) Prostate cancer status post definitive radiation.   PSA trending up.  Possible biochemical recurrence I agree with Dr. Baruch Gouty that patient needs a PSMA for further evaluation Management plan depending PSMA.  Discussed about plan of continued deprivation therapy   Family history of cancer Recommend genetic counseling for genetic testing.  He agrees  Goals of care, counseling/discussion Discussed with patient   Orders Placed This Encounter  Procedures   Ambulatory referral to Genetics    Referral Priority:   Routine    Referral Type:   Consultation    Referral Reason:   Specialty Services Required    Number of Visits Requested:   1    Follow-up to be determined.  All questions were answered. The patient knows to call  the clinic with any problems, questions or concerns. No barriers to learning was detected.  Earlie Server,  MD 12/20/2021

## 2021-12-20 NOTE — Assessment & Plan Note (Addendum)
Prostate cancer status post definitive radiation.   PSA trending up.  Possible biochemical recurrence I agree with Dr. Baruch Gouty that patient needs a PSMA for further evaluation Management plan depending PSMA.  Discussed about plan of continued deprivation therapy

## 2021-12-20 NOTE — Assessment & Plan Note (Signed)
Discussed with patient

## 2021-12-20 NOTE — Assessment & Plan Note (Signed)
Recommend genetic counseling for genetic testing.  He agrees

## 2021-12-27 ENCOUNTER — Ambulatory Visit
Admission: RE | Admit: 2021-12-27 | Discharge: 2021-12-27 | Disposition: A | Discharge status: Home / Self Care | Payer: Medicare Other | Source: Ambulatory Visit | Attending: Radiation Oncology | Admitting: Radiation Oncology

## 2021-12-27 DIAGNOSIS — Z923 Personal history of irradiation: Secondary | ICD-10-CM | POA: Insufficient documentation

## 2021-12-27 DIAGNOSIS — C61 Malignant neoplasm of prostate: Secondary | ICD-10-CM | POA: Insufficient documentation

## 2021-12-27 MED ORDER — PIFLIFOLASTAT F 18 (PYLARIFY) INJECTION
9.0000 | Freq: Once | INTRAVENOUS | Status: AC
  Administered 2021-12-27: 9.8 via INTRAVENOUS

## 2022-01-03 ENCOUNTER — Telehealth: Payer: Self-pay

## 2022-01-03 NOTE — Telephone Encounter (Signed)
Please schedule MD only this week or next, for PET scan results.

## 2022-01-10 ENCOUNTER — Inpatient Hospital Stay: Payer: Medicare Other

## 2022-01-10 ENCOUNTER — Inpatient Hospital Stay: Payer: Medicare Other | Attending: Radiation Oncology | Admitting: Licensed Clinical Social Worker

## 2022-01-10 ENCOUNTER — Encounter: Payer: Self-pay | Admitting: Licensed Clinical Social Worker

## 2022-01-10 DIAGNOSIS — Z800 Family history of malignant neoplasm of digestive organs: Secondary | ICD-10-CM

## 2022-01-10 DIAGNOSIS — C61 Malignant neoplasm of prostate: Secondary | ICD-10-CM | POA: Insufficient documentation

## 2022-01-10 DIAGNOSIS — Z8042 Family history of malignant neoplasm of prostate: Secondary | ICD-10-CM

## 2022-01-10 NOTE — Progress Notes (Signed)
REFERRING PROVIDER: Earlie Server, MD [place],  [place] [address]  PRIMARY PROVIDER:  Jodi Marble, MD  PRIMARY REASON FOR VISIT:  1. Prostate cancer ([place])   2. Family history of prostate cancer   3. Family history of colon cancer      HISTORY OF PRESENT ILLNESS:   Mr. Helsley, a 71 y.o. male, was seen for a [place] cancer genetics consultation at the request of Dr. Tasia Catchings due to a personal and family history of prostate cancer.  Mr. Monje presents to clinic today to discuss the possibility of a hereditary predisposition to cancer, genetic testing, and to further clarify his future cancer risks, as well as potential cancer risks for family members.   CANCER HISTORY:   In 2017, at the age of 17, Mr. Copado was diagnosed with prostate cancer. He had radiation in 2022.  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   Oncology History  Prostate cancer ([place])  04/21/2015 Cancer Staging   Staging form: Prostate, AJCC 8th Edition - Clinical stage from 04/21/2015: Stage IIB (cT1, cN0, cM0, PSA: 10, Grade Group: 2) - Signed by Earlie Server, MD on 12/20/2021 Stage prefix: Initial diagnosis Prostate specific antigen (PSA) range: 10 to 19 Gleason score: 7 Histologic grading system: 5 grade system   04/21/2015 Initial Diagnosis   Prostate cancer   -Per urologist who has obtained previous records, prostate biopsy 04/21/2015 which did show a focus of Gleason 3+3 adenocarcinoma in the left mid core involving 5% of the submitted tissue -Initiated patient elected active surveillance. -MRI prostate in 2017 showed no suspicious lesion in the prostate volume of 79 cc. -April 2018 biopsy showed benign tissue and a focus of atypical glands suspicious for adenocarcinoma right base.  PSA was elevated at 4.99     05/13/2018 Procedure   Per Urology note MR fusion biopsy [facility] 05/12/2020; prostate volume 72 g ROI biopsies x7 taken all showing benign prostate tissue LML  core + Gleason 4+3 adenocarcinoma involving 15% of the submitted tissue LAL core + Gleason 3+3 adenocarcinoma involving 5% of submitted tissue Remaining biopsies with benign prostate tissue   03/31/2020 Tumor Marker   PSA 10.2.   04/14/2020 Imaging   MRI prostate with and without contrast -1. PI-RADS category 4 lesion of the left anterior peripheral zone in the mid gland and apex. 2. PI-RADS category 3 lesion of the right anterior and posterolateral peripheral zone at the apex. 3. Targeting data sent to [place]. 4. Benign prostatic hypertrophy, prostate volume 102.85 cubic cm   06/16/2020 - 08/11/2020 Radiation Therapy   Prostate radiation   12/06/2020 Tumor Marker   PSA 0.11   06/06/2021 Tumor Marker   PSA 0.74   12/12/2021 Tumor Marker   PSA  2.33.   12/27/2021 Imaging   PSMA PET scan pending     RISK FACTORS:  Colonoscopy: 2021- 4 polyps, tubular adenomas.  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 Excised, margin involved.    Past Surgical History:  Procedure Laterality Date   COLONOSCOPY WITH PROPOFOL N/A 10/28/2019   Procedure: COLONOSCOPY WITH PROPOFOL;  Surgeon: [name], MD;  Location: [facility] ENDOSCOPY;  Service: Endoscopy;  Laterality: N/A;   FRACTURE SURGERY      FAMILY HISTORY:  We obtained a detailed, 4-generation family history.  Significant diagnoses are listed below: Family History  Problem Relation Age of Onset   Colon cancer Father        dx 29s   Prostate cancer Brother 31   Prostate cancer Brother 79   Kidney cancer Brother 31   Cancer Maternal Aunt        unk type   Lung cancer Maternal Uncle    Leukemia Grandson 2   Mr. Kosloski has 2 sons, 24 and 31, and 1 daughter 20, no cancers. His daughter's son has leukemia diagnosed at age 29. Mr. Newby has 4 brothers and 1 sister. One  brother had kidney cancer at 37 and is living at 67. Two brothers were recently diagnosed with prostate cancer at 61 and 22.   Mr. Geisen mother is living at 51, no history of cancer. One of her half sisters had cancer, unknown type, and her half brother passed of lung cancer.   Mr. Cuccaro father had colon cancer in his 51s and passed of it at 53. No other known cancers on this side of the family.  Mr. Rufus is unaware of previous family history of genetic testing for hereditary cancer risks. There is no reported Ashkenazi Jewish ancestry. There is no known consanguinity.    GENETIC COUNSELING ASSESSMENT: Mr. Poch is a 71 y.o. male with a personal and family history of prostate cancer which is somewhat suggestive of a hereditary cancer syndrome and predisposition to cancer. We, therefore, discussed and recommended the following at today's visit.   DISCUSSION: We discussed that approximately 10% of prostate cancer is hereditary. Most cases of hereditary prostate cancer are associated with BRCA1/BRCA2 genes, although there are other genes associated with hereditary cancer as well. Cancers and risks are gene specific. We discussed that testing is beneficial for several reasons including knowing about cancer risks, identifying potential screening and risk-reduction options that may be appropriate, and to understand if other family members could be at risk for cancer and allow them to undergo genetic testing.   We reviewed the characteristics, features and inheritance patterns of hereditary cancer syndromes. We also discussed genetic testing, including the appropriate family members to test, the process of testing, insurance coverage and turn-around-time for results. We discussed the implications of a negative, positive and/or variant of uncertain significant result. We recommended Mr. Rains pursue genetic testing for the [facility] Common Hereditary Cancers+RNA gene panel.   Based on Mr. Blaize's  personal and family history of cancer, he meets medical criteria for genetic testing. Despite that he meets criteria, he may still have an out of pocket cost. We discussed that if his out of pocket cost for testing is over $100, the laboratory will call and confirm whether he wants to proceed with testing.  If the out of pocket cost of testing is less than $100 he will be billed by the genetic testing laboratory.   PLAN: After considering the risks, benefits, and limitations, Mr. Elk provided informed consent to pursue genetic testing and the blood sample was sent to [facility] for analysis of the Common Hereditary Cancers+RNA panel. Results should be available within approximately 2-3 weeks' time, at which point they will be disclosed by telephone to Mr. Panameno, as will any additional recommendations warranted by these results. Mr. Benko will receive a summary of his genetic counseling visit and a copy of his results once available. This information will also be available in Epic.   Mr. Hernandez questions were answered to his satisfaction today. Our contact information was  provided should additional questions or concerns arise. Thank you for the referral and allowing [place] to share in the care of your patient.   Faith Rogue, MS, [facility] Genetic Counselor [email or web address] Phone: [phone]  The patient was seen for a total of 25 minutes in face-to-face genetic counseling. Patient's wife [place] was also present.  Dr. Grayland Ormond was available for discussion regarding this case.   _______________________________________________________________________ For Office Staff:  Number of people involved in session: 2 Was an Intern/ student involved with case: no

## 2022-01-16 ENCOUNTER — Ambulatory Visit: Payer: Medicare Other | Admitting: Oncology

## 2022-01-23 ENCOUNTER — Encounter: Payer: Self-pay | Admitting: Oncology

## 2022-01-23 ENCOUNTER — Inpatient Hospital Stay (HOSPITAL_BASED_OUTPATIENT_CLINIC_OR_DEPARTMENT_OTHER): Payer: Medicare Other | Admitting: Oncology

## 2022-01-23 VITALS — BP 130/71 | HR 79 | Temp 97.6°F | Wt 225.6 lb

## 2022-01-23 DIAGNOSIS — Z809 Family history of malignant neoplasm, unspecified: Secondary | ICD-10-CM

## 2022-01-23 DIAGNOSIS — C61 Malignant neoplasm of prostate: Secondary | ICD-10-CM | POA: Diagnosis present

## 2022-01-23 NOTE — Assessment & Plan Note (Addendum)
Recommend genetic counseling for genetic testing.  He has establish care with genetic counselor.

## 2022-01-23 NOTE — Progress Notes (Signed)
Hematology/Oncology Consult Note [phone] [phone]     REFERRING PROVIDER: Jodi Marble, MD   Patient Care Team: Jodi Marble, MD as PCP - General (Internal Medicine)  ASSESSMENT & PLAN:   Cancer Staging  Prostate cancer [facility]) Staging form: Prostate, AJCC 8th Edition - Clinical stage from 04/21/2015: Stage IIB (cT1, cN0, cM0, PSA: 10, Grade Group: 2) - Signed by Earlie Server, MD on 12/20/2021   Prostate cancer [facility]) Prostate cancer status post definitive radiation.   PSA trending up due to biochemical recurrence, increase of 2 ng/ ml above nadir.  Interval between radiation and recurrence less than 18 months.  High risk. PSMA PET scan showed nonspecific prostate activity.  No definitive radiographic evidence of recurrence. Discussed with patient about options of local salvage therapy with radical prostatectomy and the patient is not interested.  Other options of continued deprivation therapy +/- second-generation nonsteroidal anti-androgen.  Patient would like to hold off starting androgen deprivation therapy currently.  He would like to repeat a PSA in January and then decide.  Family history of cancer Recommend genetic counseling for genetic testing.  He has establish care with genetic counselor.  Orders Placed This Encounter  Procedures   CBC with Differential/Platelet    Standing Status:   Future    Standing Expiration Date:   01/23/2023   Comprehensive metabolic panel    Standing Status:   Future    Standing Expiration Date:   01/23/2023   PSA    Standing Status:   Future    Standing Expiration Date:   01/24/2023   Follow-up in 2 months. All questions were answered. The patient knows to call the clinic with any problems, questions or concerns.  Earlie Server, MD, PhD [facility] Hematology Oncology 01/23/2022     CHIEF COMPLAINTS/PURPOSE OF CONSULTATION:  Prostate cancer  HISTORY OF PRESENTING ILLNESS:  Jose Randolph 71 y.o.  male presents to establish care for  I have reviewed his chart and materials related to his cancer extensively and collaborated history with the patient. Summary of oncologic history is as follows: Oncology History  Prostate cancer ([place])  04/21/2015 Cancer Staging   Staging form: Prostate, AJCC 8th Edition - Clinical stage from 04/21/2015: Stage IIB (cT1, cN0, cM0, PSA: 10, Grade Group: 2) - Signed by Earlie Server, MD on 12/20/2021 Stage prefix: Initial diagnosis Prostate specific antigen (PSA) range: 10 to 19 Gleason score: 7 Histologic grading system: 5 grade system   04/21/2015 Initial Diagnosis   Prostate cancer   -Per urologist who has obtained previous records, prostate biopsy 04/21/2015 which did show a focus of Gleason 3+3 adenocarcinoma in the left mid core involving 5% of the submitted tissue -Initiated patient elected active surveillance. -MRI prostate in 2017 showed no suspicious lesion in the prostate volume of 79 cc. -April 2018 biopsy showed benign tissue and a focus of atypical glands suspicious for adenocarcinoma right base.  PSA was elevated at 4.99     05/13/2018 Procedure   Per Urology note MR fusion biopsy [facility] 05/12/2020; prostate volume 72 g ROI biopsies x7 taken all showing benign prostate tissue LML core + Gleason 4+3 adenocarcinoma involving 15% of the submitted tissue LAL core + Gleason 3+3 adenocarcinoma involving 5% of submitted tissue Remaining biopsies with benign prostate tissue   03/31/2020 Tumor Marker   PSA 10.2.   04/14/2020 Imaging   MRI prostate with and without contrast -1. PI-RADS category 4 lesion of the left anterior peripheral zone in the mid gland and  apex. 2. PI-RADS category 3 lesion of the right anterior and posterolateral peripheral zone at the apex. 3. Targeting data sent to [place]. 4. Benign prostatic hypertrophy, prostate volume 102.85 cubic cm   06/16/2020 - 08/11/2020 Radiation Therapy   Prostate radiation   12/06/2020 Tumor Marker    PSA 0.11   06/06/2021 Tumor Marker   PSA 0.74   12/12/2021 Tumor Marker   PSA  2.33.   12/27/2021 Imaging   PSMA PET scan pending    Patient was referred to establish care with medical oncology due to increase of PSA level. Patient was accompanied by wife.  Patient denies  urinary urgency, dysuria.  Occasionally patient has loose bowel movements, manageable with Imodium as needed.  Patient attributes to metformin use.  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 Jeannine Kitten and is currently on phototherapy.  Patient reported family history of prostate cancer and colon cancer.  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 Excised, margin involved.    SURGICAL HISTORY: Past Surgical History:  Procedure Laterality Date   COLONOSCOPY WITH PROPOFOL N/A 10/28/2019   Procedure: COLONOSCOPY WITH PROPOFOL;  Surgeon: [name], MD;  Location: [facility] ENDOSCOPY;  Service: Endoscopy;  Laterality: N/A;   FRACTURE SURGERY      SOCIAL HISTORY: Social History   Socioeconomic History   Marital status: Married    Spouse name: Not on file   Number of children: Not on file   Years of education: Not on file   Highest education level: Not on file  Occupational History   Not on file  Tobacco Use   Smoking status: Former    Packs/day: 0.50    Years: 5.00    Total pack years: 2.50    Types: Cigarettes    Quit date: 03/07/1983    Years since quitting: 38.9   Smokeless tobacco: Never  Vaping Use   Vaping Use: Never used  Substance and Sexual Activity   Alcohol use: Never   Drug use: Never   Sexual activity: Yes    Birth control/protection: None  Other Topics Concern   Not on file  Social History Narrative   Not on file   Social Determinants of Health    Financial Resource Strain: Not on file  Food Insecurity: Not on file  Transportation Needs: Not on file  Physical Activity: Not on file  Stress: Not on file  Social Connections: Not on file  Intimate Partner Violence: Not on file    FAMILY HISTORY: Family History  Problem Relation Age of Onset   Colon cancer Father        dx 73s   Prostate cancer Brother 64   Prostate cancer Brother 62   Kidney cancer Brother 46   Cancer Maternal Aunt        unk type   Lung cancer Maternal Uncle    Leukemia Grandson 2    ALLERGIES:  is allergic to oxycodone and bee pollen.  MEDICATIONS:  Current Outpatient Medications  Medication Sig Dispense Refill   amLODipine-olmesartan (AZOR) 10-40 MG tablet Take 1 tablet by mouth every morning.     atorvastatin (LIPITOR) 40 MG tablet Take 40 mg by mouth daily.     carvedilol (COREG CR) 20 MG 24 hr capsule Take 20 mg  by mouth daily.     Cholecalciferol 25 MCG (1000 UT) tablet Take by mouth.     dapagliflozin propanediol (FARXIGA) 5 MG TABS tablet Take 5 mg by mouth daily.     halobetasol (ULTRAVATE) 0.05 % ointment Apply topically.     irbesartan-hydrochlorothiazide (AVALIDE) 300-12.5 MG tablet Take 1 tablet by mouth daily.     Lancets (ONETOUCH ULTRASOFT) lancets USE WITH DEVICE TO CHECK SUGARS TWICE DAILY DX E11.9     Mechlorethamine HCl (VALCHLOR) 0.016 % GEL Apply topically.     metFORMIN (GLUCOPHAGE) 1000 MG tablet Take 1,000 mg by mouth 2 (two) times daily.     ONETOUCH ULTRA test strip 2 (two) times daily.     tadalafil (CIALIS) 20 MG tablet EVERY 36 HOURS AS NEEDED FOR ED.     tamsulosin (FLOMAX) 0.4 MG CAPS capsule Take 1 capsule (0.4 mg total) by mouth 2 (two) times daily. 180 capsule 3   No current facility-administered medications for this visit.    Review of Systems  Constitutional:  Negative for appetite change, chills, fatigue, fever and unexpected weight change.  HENT:   Negative for hearing loss and voice change.   Eyes:   Negative for eye problems and icterus.  Respiratory:  Negative for chest tightness, cough and shortness of breath.   Cardiovascular:  Negative for chest pain and leg swelling.  Gastrointestinal:  Positive for diarrhea. Negative for abdominal distention and abdominal pain.  Endocrine: Negative for hot flashes.  Genitourinary:  Negative for difficulty urinating, dysuria and frequency.   Musculoskeletal:  Negative for arthralgias.  Skin:  Negative for itching and rash.  Neurological:  Negative for light-headedness and numbness.  Hematological:  Negative for adenopathy. Does not bruise/bleed easily.  Psychiatric/Behavioral:  Negative for confusion.      PHYSICAL EXAMINATION: ECOG PERFORMANCE STATUS: 1 - Symptomatic but completely ambulatory  Vitals:   01/23/22 1312  BP: 130/71  Pulse: 79  Temp: 97.6 F (36.4 C)  SpO2: 97%   Filed Weights   01/23/22 1312  Weight: 225 lb 9.6 oz (102.3 kg)    Physical Exam Constitutional:      General: He is not in acute distress.    Appearance: He is not diaphoretic.  HENT:     Head: Normocephalic and atraumatic.     Nose: Nose normal.     Mouth/Throat:     Pharynx: No oropharyngeal exudate.  Eyes:     General: No scleral icterus.    Pupils: Pupils are equal, round, and reactive to light.  Cardiovascular:     Rate and Rhythm: Normal rate and regular rhythm.     Heart sounds: No murmur heard. Pulmonary:     Effort: Pulmonary effort is normal. No respiratory distress.     Breath sounds: No rales.  Chest:     Chest wall: No tenderness.  Abdominal:     General: There is no distension.     Palpations: Abdomen is soft.     Tenderness: There is no abdominal tenderness.  Musculoskeletal:        General: Normal range of motion.     Cervical back: Normal range of motion and neck supple.  Skin:    General: Skin is warm and dry.     Findings: No erythema.  Neurological:     Mental Status: He is alert and oriented to person, place, and time.      Cranial Nerves: No cranial nerve deficit.     Motor: No abnormal muscle tone.  Coordination: Coordination normal.  Psychiatric:        Mood and Affect: Affect normal.      LABORATORY DATA:  I have reviewed the data as listed    Latest Ref Rng & Units 12/06/2020    7:46 AM 07/28/2020    8:18 AM 07/01/2020    8:18 AM  CBC  WBC 4.0 - 10.5 K/uL 4.7  4.9  4.9   Hemoglobin 13.0 - 17.0 g/dL 12.8  12.5  13.5   Hematocrit 39.0 - 52.0 % 37.8  36.3  40.2   Platelets 150 - 400 K/uL 205  171  215        No data to display            RADIOGRAPHIC STUDIES: I have personally reviewed the radiological images as listed and agreed with the findings in the report. NM PET (PSMA) SKULL TO MID THIGH  Result Date: 12/29/2021 CLINICAL DATA:  Prostate carcinoma. Status post radiation treatment. Elevated PSA equal 2.3 EXAM: NUCLEAR MEDICINE PET SKULL BASE TO THIGH TECHNIQUE: 9.8 mCi F18 Piflufolastat (Pylarify) was injected intravenously. Full-ring PET imaging was performed from the skull base to thigh after the radiotracer. CT data was obtained and used for attenuation correction and anatomic localization. COMPARISON:  None Available. FINDINGS: NECK No radiotracer activity in neck lymph nodes. Incidental CT finding: None. CHEST No radiotracer accumulation within mediastinal or hilar lymph nodes. No suspicious pulmonary nodules on the CT scan. Incidental CT finding: None. ABDOMEN/PELVIS Prostate: Mild activity the prostate gland is nonspecific. Lymph nodes: No abnormal radiotracer accumulation within pelvic or abdominal nodes. Liver: No evidence of liver metastasis. Incidental CT finding: None. SKELETON No focal activity to suggest skeletal metastasis. No suspicious sclerotic lesions IMPRESSION: 1. Nonspecific activity in the prostate gland. 2. No evidence metastatic adenopathy in the pelvis or periaortic retroperitoneum. 3. No evidence of visceral metastasis or skeletal metastasis. Electronically  Signed   By: Suzy Bouchard M.D.   On: 12/29/2021 11:14

## 2022-01-23 NOTE — Assessment & Plan Note (Addendum)
Prostate cancer status post definitive radiation.   PSA trending up due to biochemical recurrence, increase of 2 ng/ ml above nadir.  Interval between radiation and recurrence less than 18 months.  High risk. PSMA PET scan showed nonspecific prostate activity.  No definitive radiographic evidence of recurrence. Discussed with patient about options of local salvage therapy with radical prostatectomy and the patient is not interested.  Other options of continued deprivation therapy +/- second-generation nonsteroidal anti-androgen.  Patient would like to hold off starting androgen deprivation therapy currently.  He would like to repeat a PSA in January and then decide.

## 2022-01-31 ENCOUNTER — Encounter: Payer: Self-pay | Admitting: Licensed Clinical Social Worker

## 2022-01-31 ENCOUNTER — Telehealth: Payer: Self-pay | Admitting: Licensed Clinical Social Worker

## 2022-01-31 ENCOUNTER — Ambulatory Visit: Payer: Self-pay | Admitting: Licensed Clinical Social Worker

## 2022-01-31 DIAGNOSIS — Z1379 Encounter for other screening for genetic and chromosomal anomalies: Secondary | ICD-10-CM | POA: Insufficient documentation

## 2022-01-31 NOTE — Telephone Encounter (Signed)
I contacted Jose Randolph to discuss his genetic testing results. No pathogenic variants were identified in the 47 genes analyzed. Detailed clinic note to follow.\   The test report has been scanned into EPIC and is located under the Molecular Pathology section of the Results Review tab.  A portion of the result report is included below for reference.      Jose Rogue, MS, [facility] Genetic Counselor [email or web address] Phone: [phone]

## 2022-01-31 NOTE — Progress Notes (Signed)
HPI:   Mr. Krisko was previously seen in the [facility] clinic due to a personal and family history of cancer and concerns regarding a hereditary predisposition to cancer. Please refer to our prior cancer genetics clinic note for more information regarding our discussion, assessment and recommendations, at the time. Mr. Farrel recent genetic test results were disclosed to him, as were recommendations warranted by these results. These results and recommendations are discussed in more detail below.  CANCER HISTORY:  Oncology History  Prostate cancer ([place])  04/21/2015 Cancer Staging   Staging form: Prostate, AJCC 8th Edition - Clinical stage from 04/21/2015: Stage IIB (cT1, cN0, cM0, PSA: 10, Grade Group: 2) - Signed by Earlie Server, MD on 12/20/2021 Stage prefix: Initial diagnosis Prostate specific antigen (PSA) range: 10 to 19 Gleason score: 7 Histologic grading system: 5 grade system   04/21/2015 Initial Diagnosis   Prostate cancer   -Per urologist who has obtained previous records, prostate biopsy 04/21/2015 which did show a focus of Gleason 3+3 adenocarcinoma in the left mid core involving 5% of the submitted tissue -Initiated patient elected active surveillance. -MRI prostate in 2017 showed no suspicious lesion in the prostate volume of 79 cc. -April 2018 biopsy showed benign tissue and a focus of atypical glands suspicious for adenocarcinoma right base.  PSA was elevated at 4.99     05/13/2018 Procedure   Per Urology note MR fusion biopsy [facility] 05/12/2020; prostate volume 72 g ROI biopsies x7 taken all showing benign prostate tissue LML core + Gleason 4+3 adenocarcinoma involving 15% of the submitted tissue LAL core + Gleason 3+3 adenocarcinoma involving 5% of submitted tissue Remaining biopsies with benign prostate tissue   03/31/2020 Tumor Marker   PSA 10.2.   04/14/2020 Imaging   MRI prostate with and without contrast -1. PI-RADS category 4 lesion of the left  anterior peripheral zone in the mid gland and apex. 2. PI-RADS category 3 lesion of the right anterior and posterolateral peripheral zone at the apex. 3. Targeting data sent to [place]. 4. Benign prostatic hypertrophy, prostate volume 102.85 cubic cm   06/16/2020 - 08/11/2020 Radiation Therapy   Prostate radiation   12/06/2020 Tumor Marker   PSA 0.11   06/06/2021 Tumor Marker   PSA 0.74   12/12/2021 Tumor Marker   PSA  2.33.   12/27/2021 Imaging   PSMA PET scan pending    Genetic Testing   Negative genetic testing. No pathogenic variants identified on the [facility] Common Hereditary Cancers+RNA panel. The report date is 01/27/2022.  The Common Hereditary Cancers Panel + RNA offered by [facility] includes sequencing and/or deletion duplication testing of the following 48 genes: APC*, ATM*, AXIN2, BAP1, BARD1, BMPR1A, BRCA1, BRCA2, BRIP1, CDH1, CDK4, CDKN2A (p14ARF), CDKN2A (p16INK4a), CHEK2, CTNNA1, DICER1*, EPCAM*, FH*, GREM1*, HOXB13, KIT, MBD4, MEN1*, MLH1*, MSH2*, MSH3*, MSH6*, MUTYH, NF1*, NTHL1, PALB2, PDGFRA, PMS2*, POLD1*, POLE, PTEN*, RAD51C, RAD51D, SDHA*, SDHB, SDHC*, SDHD, SMAD4, SMARCA4, STK11, TP53, TSC1*, TSC2, VHL.      FAMILY HISTORY:  We obtained a detailed, 4-generation family history.  Significant diagnoses are listed below: Family History  Problem Relation Age of Onset   Colon cancer Father        dx 90s   Prostate cancer Brother 66   Prostate cancer Brother 24   Kidney cancer Brother 62   Cancer Maternal Aunt        unk type   Lung cancer Maternal Uncle    Leukemia Grandson 2   Mr. Bouchillon has 2 sons,  26 and 8, and 1 daughter 29, no cancers. His daughter's son has leukemia diagnosed at age 38. Mr. Seminara has 4 brothers and 1 sister. One brother had kidney cancer at 52 and is living at 41. Two brothers were recently diagnosed with prostate cancer at 47 and 22.    Mr. Wise mother is living at 58, no history of cancer. One of her half sisters had cancer, unknown  type, and her half brother passed of lung cancer.    Mr. Hair father had colon cancer in his 87s and passed of it at 31. No other known cancers on this side of the family.   Mr. Sokolowski is unaware of previous family history of genetic testing for hereditary cancer risks. There is no reported Ashkenazi Jewish ancestry. There is no known consanguinity.      GENETIC TEST RESULTS:  The [facility] Common Hereditary Cancers+RNA Panel found no pathogenic mutations.   The Common Hereditary Cancers Panel + RNA offered by [facility] includes sequencing and/or deletion duplication testing of the following 48 genes: APC*, ATM*, AXIN2, BAP1, BARD1, BMPR1A, BRCA1, BRCA2, BRIP1, CDH1, CDK4, CDKN2A (p14ARF), CDKN2A (p16INK4a), CHEK2, CTNNA1, DICER1*, EPCAM*, FH*, GREM1*, HOXB13, KIT, MBD4, MEN1*, MLH1*, MSH2*, MSH3*, MSH6*, MUTYH, NF1*, NTHL1, PALB2, PDGFRA, PMS2*, POLD1*, POLE, PTEN*, RAD51C, RAD51D, SDHA*, SDHB, SDHC*, SDHD, SMAD4, SMARCA4, STK11, TP53, TSC1*, TSC2, VHL.   The test report has been scanned into EPIC and is located under the Molecular Pathology section of the Results Review tab.  A portion of the result report is included below for reference. Genetic testing reported out on 01/27/2022.     Even though a pathogenic variant was not identified, possible explanations for the cancer in the family may include: There may be no hereditary risk for cancer in the family. The cancers in Mr. Lown and/or his family may be sporadic/familial or due to other genetic and environmental factors. There may be a gene mutation in one of these genes that current testing methods cannot detect but that chance is small. There could be another gene that has not yet been discovered, or that we have not yet tested, that is responsible for the cancer diagnoses in the family.  It is also possible there is a hereditary cause for the cancer in the family that Mr. Granados did not inherit.  Therefore, it is important to remain  in touch with cancer genetics in the future so that we can continue to offer Mr. Deroche the most up to date genetic testing.   ADDITIONAL GENETIC TESTING:  We discussed with Mr. Steeves that his genetic testing was fairly extensive.  If there are additional relevant genes identified to increase cancer risk that can be analyzed in the future, we would be happy to discuss and coordinate this testing at that time.    CANCER SCREENING RECOMMENDATIONS:  Mr. Petrovich test result is considered negative (normal).  This means that we have not identified a hereditary cause for his personal and family history of cancer at this time.   An individual's cancer risk and medical management are not determined by genetic test results alone. Overall cancer risk assessment incorporates additional factors, including personal medical history, family history, and any available genetic information that may result in a personalized plan for cancer prevention and surveillance. Therefore, it is recommended he continue to follow the cancer management and screening guidelines provided by his oncology and primary healthcare provider.  RECOMMENDATIONS FOR FAMILY MEMBERS:   Since he did not inherit a identifiable mutation  in a cancer predisposition gene included on this panel, his children could not have inherited a known mutation from him in one of these genes. Individuals in this family might be at some increased risk of developing cancer, over the general population risk, due to the family history of cancer.  Individuals in the family should notify their providers of the family history of cancer. We recommend women in this family have a yearly mammogram beginning at age 102, or 25 years younger than the earliest onset of cancer, an annual clinical breast exam, and perform monthly breast self-exams.  Family members should have colonoscopies by at age 51, or earlier, as recommended by their providers. Other members of the family may  still carry a pathogenic variant in one of these genes that Mr. Dittmar did not inherit. Based on the family history, we recommend his brothers/paternal relatives have genetic counseling and testing. Mr. Loiseau will let us know if we can be of any assistance in coordinating genetic counseling and/or testing for this family member.     FOLLOW-UP:  Lastly, we discussed with Mr. Vonbehren that cancer genetics is a rapidly advancing field and it is possible that new genetic tests will be appropriate for him and/or his family members in the future. We encouraged him to remain in contact with cancer genetics on an annual basis so we can update his personal and family histories and let him know of advances in cancer genetics that may benefit this family.   Our contact number was provided. Mr. Henk questions were answered to his satisfaction, and he knows he is welcome to call us at anytime with additional questions or concerns.    Faith Rogue, MS, [facility] Genetic Counselor [email or web address] Phone: [phone]

## 2022-03-29 ENCOUNTER — Other Ambulatory Visit: Payer: Self-pay | Admitting: Oncology

## 2022-03-29 ENCOUNTER — Inpatient Hospital Stay: Payer: Medicare Other | Attending: Radiation Oncology

## 2022-03-29 DIAGNOSIS — C61 Malignant neoplasm of prostate: Secondary | ICD-10-CM | POA: Insufficient documentation

## 2022-03-29 DIAGNOSIS — Z806 Family history of leukemia: Secondary | ICD-10-CM | POA: Insufficient documentation

## 2022-03-29 DIAGNOSIS — Z87891 Personal history of nicotine dependence: Secondary | ICD-10-CM | POA: Insufficient documentation

## 2022-03-29 DIAGNOSIS — Z801 Family history of malignant neoplasm of trachea, bronchus and lung: Secondary | ICD-10-CM | POA: Insufficient documentation

## 2022-03-29 DIAGNOSIS — Z8051 Family history of malignant neoplasm of kidney: Secondary | ICD-10-CM | POA: Insufficient documentation

## 2022-03-29 DIAGNOSIS — Z8042 Family history of malignant neoplasm of prostate: Secondary | ICD-10-CM | POA: Insufficient documentation

## 2022-04-03 ENCOUNTER — Inpatient Hospital Stay: Payer: Medicare Other

## 2022-04-03 DIAGNOSIS — Z8051 Family history of malignant neoplasm of kidney: Secondary | ICD-10-CM | POA: Diagnosis not present

## 2022-04-03 DIAGNOSIS — Z8042 Family history of malignant neoplasm of prostate: Secondary | ICD-10-CM | POA: Diagnosis not present

## 2022-04-03 DIAGNOSIS — C61 Malignant neoplasm of prostate: Secondary | ICD-10-CM

## 2022-04-03 DIAGNOSIS — Z87891 Personal history of nicotine dependence: Secondary | ICD-10-CM | POA: Diagnosis not present

## 2022-04-03 DIAGNOSIS — Z801 Family history of malignant neoplasm of trachea, bronchus and lung: Secondary | ICD-10-CM | POA: Diagnosis not present

## 2022-04-03 DIAGNOSIS — Z806 Family history of leukemia: Secondary | ICD-10-CM | POA: Diagnosis not present

## 2022-04-03 LAB — COMPREHENSIVE METABOLIC PANEL WITH GFR
ALT: 14 U/L (ref 0–44)
AST: 20 U/L (ref 15–41)
Albumin: 4.1 g/dL (ref 3.5–5.0)
Alkaline Phosphatase: 59 U/L (ref 38–126)
Anion gap: 10 (ref 5–15)
BUN: 14 mg/dL (ref 8–23)
CO2: 25 mmol/L (ref 22–32)
Calcium: 9.3 mg/dL (ref 8.9–10.3)
Chloride: 105 mmol/L (ref 98–111)
Creatinine, Ser: 0.79 mg/dL (ref 0.61–1.24)
GFR, Estimated: >60 mL/min
Glucose, Bld: 119 mg/dL — ABNORMAL HIGH (ref 70–99)
Potassium: 3.4 mmol/L — ABNORMAL LOW (ref 3.5–5.1)
Sodium: 140 mmol/L (ref 135–145)
Total Bilirubin: 1.2 mg/dL (ref 0.3–1.2)
Total Protein: 7.7 g/dL (ref 6.5–8.1)

## 2022-04-03 LAB — CBC WITH DIFFERENTIAL/PLATELET
Abs Immature Granulocytes: 0.02 10*3/uL (ref 0.00–0.07)
Basophils Absolute: 0 10*3/uL (ref 0.0–0.1)
Basophils Relative: 1 %
Eosinophils Absolute: 0.2 10*3/uL (ref 0.0–0.5)
Eosinophils Relative: 4 %
HCT: 39.6 % (ref 39.0–52.0)
Hemoglobin: 13.4 g/dL (ref 13.0–17.0)
Immature Granulocytes: 0 %
Lymphocytes Relative: 28 %
Lymphs Abs: 1.5 10*3/uL (ref 0.7–4.0)
MCH: 30.4 pg (ref 26.0–34.0)
MCHC: 33.8 g/dL (ref 30.0–36.0)
MCV: 89.8 fL (ref 80.0–100.0)
Monocytes Absolute: 0.5 10*3/uL (ref 0.1–1.0)
Monocytes Relative: 10 %
Neutro Abs: 3.1 10*3/uL (ref 1.7–7.7)
Neutrophils Relative %: 57 %
Platelets: 190 10*3/uL (ref 150–400)
RBC: 4.41 MIL/uL (ref 4.22–5.81)
RDW: 14 % (ref 11.5–15.5)
WBC: 5.4 10*3/uL (ref 4.0–10.5)
nRBC: 0 % (ref 0.0–0.2)

## 2022-04-03 LAB — PSA: Prostatic Specific Antigen: 1.55 ng/mL (ref 0.00–4.00)

## 2022-04-05 ENCOUNTER — Encounter: Payer: Self-pay | Admitting: Oncology

## 2022-04-05 ENCOUNTER — Inpatient Hospital Stay: Payer: Medicare Other

## 2022-04-05 ENCOUNTER — Inpatient Hospital Stay (HOSPITAL_BASED_OUTPATIENT_CLINIC_OR_DEPARTMENT_OTHER): Payer: Medicare Other | Admitting: Oncology

## 2022-04-05 VITALS — BP 143/78 | HR 72 | Temp 96.0°F | Resp 18 | Wt 225.4 lb

## 2022-04-05 DIAGNOSIS — C61 Malignant neoplasm of prostate: Secondary | ICD-10-CM

## 2022-04-05 DIAGNOSIS — Z809 Family history of malignant neoplasm, unspecified: Secondary | ICD-10-CM | POA: Diagnosis not present

## 2022-04-05 NOTE — Progress Notes (Signed)
Hematology/Oncology Consult Note [phone] [phone]     REFERRING PROVIDER: Jodi Marble, MD   CHIEF COMPLAINTS/PURPOSE OF CONSULTATION:  Prostate cancer  ASSESSMENT & PLAN:   Cancer Staging  Prostate cancer [facility]) Staging form: Prostate, AJCC 8th Edition - Clinical stage from 04/21/2015: Stage IIB (cT1, cN0, cM0, PSA: 10, Grade Group: 2) - Signed by Earlie Server, MD on 12/20/2021   Prostate cancer [facility]) Prostate cancer status post definitive radiation.   PSA trending up due to biochemical recurrence, increase of 2 ng/ ml above nadir.  Interval between radiation and recurrence less than 18 months.  High risk. PSMA PET scan showed nonspecific prostate activity.  No definitive radiographic evidence of recurrence. PSA slightly decreased.  Continue observation.   Family history of cancer Genetic testing is negative.   Orders Placed This Encounter  Procedures   CBC with Differential/Platelet    Standing Status:   Future    Standing Expiration Date:   04/06/2023   Comprehensive metabolic panel    Standing Status:   Future    Standing Expiration Date:   04/05/2023   PSA    Standing Status:   Future    Standing Expiration Date:   04/06/2023   Follow-up in 3 months. All questions were answered. The patient knows to call the clinic with any problems, questions or concerns.  Earlie Server, MD, PhD [facility] Hematology Oncology 04/05/2022       HISTORY OF PRESENTING ILLNESS:  Jose Randolph 72 y.o. male presents to establish care for  I have reviewed his chart and materials related to his cancer extensively and collaborated history with the patient. Summary of oncologic history is as follows: Oncology History  Prostate cancer ([place])  04/21/2015 Cancer Staging   Staging form: Prostate, AJCC 8th Edition - Clinical stage from 04/21/2015: Stage IIB (cT1, cN0, cM0, PSA: 10, Grade Group: 2) - Signed by Earlie Server, MD on 12/20/2021 Stage prefix: Initial  diagnosis Prostate specific antigen (PSA) range: 10 to 19 Gleason score: 7 Histologic grading system: 5 grade system   04/21/2015 Initial Diagnosis   Prostate cancer   -Per urologist who has obtained previous records, prostate biopsy 04/21/2015 which did show a focus of Gleason 3+3 adenocarcinoma in the left mid core involving 5% of the submitted tissue -Initiated patient elected active surveillance. -MRI prostate in 2017 showed no suspicious lesion in the prostate volume of 79 cc. -April 2018 biopsy showed benign tissue and a focus of atypical glands suspicious for adenocarcinoma right base.  PSA was elevated at 4.99     05/13/2018 Procedure   Per Urology note MR fusion biopsy [facility] 05/12/2020; prostate volume 72 g ROI biopsies x7 taken all showing benign prostate tissue LML core + Gleason 4+3 adenocarcinoma involving 15% of the submitted tissue LAL core + Gleason 3+3 adenocarcinoma involving 5% of submitted tissue Remaining biopsies with benign prostate tissue   03/31/2020 Tumor Marker   PSA 10.2.   04/14/2020 Imaging   MRI prostate with and without contrast -1. PI-RADS category 4 lesion of the left anterior peripheral zone in the mid gland and apex. 2. PI-RADS category 3 lesion of the right anterior and posterolateral peripheral zone at the apex. 3. Targeting data sent to [place]. 4. Benign prostatic hypertrophy, prostate volume 102.85 cubic cm   06/16/2020 - 08/11/2020 Radiation Therapy   Prostate radiation   12/06/2020 Tumor Marker   PSA 0.11   06/06/2021 Tumor Marker   PSA 0.74   12/12/2021 Tumor Marker  PSA  2.33.   12/27/2021 Imaging   PSMA PET scan pending   01/31/2022 Genetic Testing   Negative genetic testing. No pathogenic variants identified on the [facility] Common Hereditary Cancers+RNA panel. The report date is 01/27/2022.  The Common Hereditary Cancers Panel + RNA offered by [facility] includes sequencing and/or deletion duplication testing of the following 48  genes: APC*, ATM*, AXIN2, BAP1, BARD1, BMPR1A, BRCA1, BRCA2, BRIP1, CDH1, CDK4, CDKN2A (p14ARF), CDKN2A (p16INK4a), CHEK2, CTNNA1, DICER1*, EPCAM*, FH*, GREM1*, HOXB13, KIT, MBD4, MEN1*, MLH1*, MSH2*, MSH3*, MSH6*, MUTYH, NF1*, NTHL1, PALB2, PDGFRA, PMS2*, POLD1*, POLE, PTEN*, RAD51C, RAD51D, SDHA*, SDHB, SDHC*, SDHD, SMAD4, SMARCA4, STK11, TP53, TSC1*, TSC2, VHL.    04/03/2022 Tumor Marker   PSA 1.55    Patient was referred to establish care with medical oncology due to increase of PSA level. Patient was accompanied by wife.  Patient denies  urinary urgency, dysuria.  Occasionally patient has loose bowel movements, manageable with Imodium as needed.  Patient attributes to metformin use.  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 Jeannine Kitten and is currently on phototherapy. Patient reported family history of prostate cancer and colon cancer.    [name] who has above history reviewed by me today presents for follow up visit for prostate cancer.  He denies any new complaints.     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    SURGICAL HISTORY: Past Surgical History:  Procedure Laterality Date   COLONOSCOPY WITH PROPOFOL N/A 10/28/2019   Procedure: COLONOSCOPY WITH PROPOFOL;  Surgeon: [name], MD;  Location: [facility] ENDOSCOPY;  Service: Endoscopy;  Laterality: N/A;   FRACTURE SURGERY      SOCIAL HISTORY: Social History   Socioeconomic History   Marital status: Married    Spouse name: Not on file   Number of children: Not on file   Years of education: Not on file   Highest education level: Not on file  Occupational History   Not on file  Tobacco Use   Smoking status: Former     Packs/day: 0.50    Years: 5.00    Total pack years: 2.50    Types: Cigarettes    Quit date: 03/07/1983    Years since quitting: 39.1   Smokeless tobacco: Never  Vaping Use   Vaping Use: Never used  Substance and Sexual Activity   Alcohol use: Never   Drug use: Never   Sexual activity: Yes    Birth control/protection: None  Other Topics Concern   Not on file  Social History Narrative   Not on file   Social Determinants of Health   Financial Resource Strain: Not on file  Food Insecurity: Not on file  Transportation Needs: Not on file  Physical Activity: Not on file  Stress: Not on file  Social Connections: Not on file  Intimate Partner Violence: Not on file    FAMILY HISTORY: Family History  Problem Relation Age of Onset   Colon cancer Father        dx 82s   Prostate cancer Brother 33   Prostate cancer Brother 69   Kidney cancer Brother 33   Cancer Maternal Aunt        unk type  Lung cancer Maternal Uncle    Leukemia Grandson 2    ALLERGIES:  is allergic to oxycodone and bee pollen.  MEDICATIONS:  Current Outpatient Medications  Medication Sig Dispense Refill   amLODipine-olmesartan (AZOR) 10-40 MG tablet Take 1 tablet by mouth every morning.     atorvastatin (LIPITOR) 40 MG tablet Take 40 mg by mouth daily.     carvedilol (COREG CR) 20 MG 24 hr capsule Take 20 mg by mouth daily.     Cholecalciferol 25 MCG (1000 UT) tablet Take by mouth.     dapagliflozin propanediol (FARXIGA) 5 MG TABS tablet Take 5 mg by mouth daily.     halobetasol (ULTRAVATE) 0.05 % ointment Apply topically.     irbesartan-hydrochlorothiazide (AVALIDE) 300-12.5 MG tablet Take 1 tablet by mouth daily.     Lancets (ONETOUCH ULTRASOFT) lancets USE WITH DEVICE TO CHECK SUGARS TWICE DAILY DX E11.9     Mechlorethamine HCl (VALCHLOR) 0.016 % GEL Apply topically.     metFORMIN (GLUCOPHAGE) 1000 MG tablet Take 1,000 mg by mouth 2 (two) times daily.     ONETOUCH ULTRA test strip 2 (two) times daily.      tadalafil (CIALIS) 20 MG tablet EVERY 36 HOURS AS NEEDED FOR ED.     tamsulosin (FLOMAX) 0.4 MG CAPS capsule Take 1 capsule (0.4 mg total) by mouth 2 (two) times daily. 180 capsule 3   No current facility-administered medications for this visit.    Review of Systems  Constitutional:  Negative for appetite change, chills, fatigue, fever and unexpected weight change.  HENT:   Negative for hearing loss and voice change.   Eyes:  Negative for eye problems and icterus.  Respiratory:  Negative for chest tightness, cough and shortness of breath.   Cardiovascular:  Negative for chest pain and leg swelling.  Gastrointestinal:  Positive for diarrhea. Negative for abdominal distention and abdominal pain.  Endocrine: Negative for hot flashes.  Genitourinary:  Negative for difficulty urinating, dysuria and frequency.   Musculoskeletal:  Negative for arthralgias.  Skin:  Negative for itching and rash.  Neurological:  Negative for light-headedness and numbness.  Hematological:  Negative for adenopathy. Does not bruise/bleed easily.  Psychiatric/Behavioral:  Negative for confusion.      PHYSICAL EXAMINATION: ECOG PERFORMANCE STATUS: 1 - Symptomatic but completely ambulatory  Vitals:   04/05/22 1027  BP: (!) 143/78  Pulse: 72  Resp: 18  Temp: (!) 96 F (35.6 C)  SpO2: 100%   Filed Weights   04/05/22 1027  Weight: 225 lb 6.4 oz (102.2 kg)    Physical Exam Constitutional:      General: He is not in acute distress.    Appearance: He is not diaphoretic.  HENT:     Head: Normocephalic and atraumatic.     Nose: Nose normal.     Mouth/Throat:     Pharynx: No oropharyngeal exudate.  Eyes:     General: No scleral icterus.    Pupils: Pupils are equal, round, and reactive to light.  Cardiovascular:     Rate and Rhythm: Normal rate and regular rhythm.     Heart sounds: No murmur heard. Pulmonary:     Effort: Pulmonary effort is normal. No respiratory distress.     Breath sounds: No  rales.  Chest:     Chest wall: No tenderness.  Abdominal:     General: There is no distension.     Palpations: Abdomen is soft.     Tenderness: There is no abdominal tenderness.  Musculoskeletal:        General: Normal range of motion.     Cervical back: Normal range of motion and neck supple.  Skin:    General: Skin is warm and dry.     Findings: No erythema.  Neurological:     Mental Status: He is alert and oriented to person, place, and time.     Cranial Nerves: No cranial nerve deficit.     Motor: No abnormal muscle tone.     Coordination: Coordination normal.  Psychiatric:        Mood and Affect: Affect normal.      LABORATORY DATA:  I have reviewed the data as listed    Latest Ref Rng & Units 04/03/2022   10:40 AM 12/06/2020    7:46 AM 07/28/2020    8:18 AM  CBC  WBC 4.0 - 10.5 K/uL 5.4  4.7  4.9   Hemoglobin 13.0 - 17.0 g/dL 13.4  12.8  12.5   Hematocrit 39.0 - 52.0 % 39.6  37.8  36.3   Platelets 150 - 400 K/uL 190  205  171       Latest Ref Rng & Units 04/03/2022   10:40 AM  CMP  Glucose 70 - 99 mg/dL 119   BUN 8 - 23 mg/dL 14   Creatinine 0.61 - 1.24 mg/dL 0.79   Sodium 135 - 145 mmol/L 140   Potassium 3.5 - 5.1 mmol/L 3.4   Chloride 98 - 111 mmol/L 105   CO2 22 - 32 mmol/L 25   Calcium 8.9 - 10.3 mg/dL 9.3   Total Protein 6.5 - 8.1 g/dL 7.7   Total Bilirubin 0.3 - 1.2 mg/dL 1.2   Alkaline Phos 38 - 126 U/L 59   AST 15 - 41 U/L 20   ALT 0 - 44 U/L 14      RADIOGRAPHIC STUDIES: I have personally reviewed the radiological images as listed and agreed with the findings in the report. No results found.

## 2022-04-05 NOTE — Assessment & Plan Note (Addendum)
Prostate cancer status post definitive radiation.   PSA trending up due to biochemical recurrence, increase of 2 ng/ ml above nadir.  Interval between radiation and recurrence less than 18 months.  High risk. PSMA PET scan showed nonspecific prostate activity.  No definitive radiographic evidence of recurrence. PSA slightly decreased. Discussed with patient that he likely has biochemical recurrence, and if PSA persistently increases, will need to start on ADT in the future.  Continue observation.

## 2022-04-05 NOTE — Assessment & Plan Note (Addendum)
Genetic testing is negative.

## 2022-04-09 ENCOUNTER — Other Ambulatory Visit: Payer: Self-pay | Admitting: Internal Medicine

## 2022-04-09 DIAGNOSIS — I10 Essential (primary) hypertension: Secondary | ICD-10-CM

## 2022-05-07 ENCOUNTER — Other Ambulatory Visit: Payer: Self-pay | Admitting: Internal Medicine

## 2022-05-07 DIAGNOSIS — I10 Essential (primary) hypertension: Secondary | ICD-10-CM

## 2022-05-10 ENCOUNTER — Ambulatory Visit: Payer: Medicare Other | Admitting: Internal Medicine

## 2022-05-16 ENCOUNTER — Other Ambulatory Visit: Payer: Medicare Other

## 2022-05-16 ENCOUNTER — Other Ambulatory Visit: Payer: Self-pay | Admitting: Internal Medicine

## 2022-05-17 LAB — COMPREHENSIVE METABOLIC PANEL
ALT: 10 IU/L (ref 0–44)
AST: 13 IU/L (ref 0–40)
Albumin/Globulin Ratio: 1.6 (ref 1.2–2.2)
Albumin: 4.3 g/dL (ref 3.9–4.9)
Alkaline Phosphatase: 75 IU/L (ref 44–121)
BUN/Creatinine Ratio: 13 (ref 10–24)
BUN: 9 mg/dL (ref 8–27)
Bilirubin Total: 0.6 mg/dL (ref 0.0–1.2)
CO2: 23 mmol/L (ref 20–29)
Calcium: 9.9 mg/dL (ref 8.6–10.2)
Chloride: 106 mmol/L (ref 96–106)
Creatinine, Ser: 0.69 mg/dL — ABNORMAL LOW (ref 0.76–1.27)
Globulin, Total: 2.7 g/dL (ref 1.5–4.5)
Glucose: 110 mg/dL — ABNORMAL HIGH (ref 70–99)
Potassium: 4.2 mmol/L (ref 3.5–5.2)
Sodium: 144 mmol/L (ref 134–144)
Total Protein: 7 g/dL (ref 6.0–8.5)
eGFR: 100 mL/min/{1.73_m2} (ref >59)

## 2022-05-17 LAB — CBC WITH DIFFERENTIAL/PLATELET
Basophils Absolute: 0.1 10*3/uL (ref 0.0–0.2)
Basos: 1 %
EOS (ABSOLUTE): 0.2 10*3/uL (ref 0.0–0.4)
Eos: 5 %
Hematocrit: 42.1 % (ref 37.5–51.0)
Hemoglobin: 13.7 g/dL (ref 13.0–17.7)
Immature Grans (Abs): 0 10*3/uL (ref 0.0–0.1)
Immature Granulocytes: 0 %
Lymphocytes Absolute: 1.2 10*3/uL (ref 0.7–3.1)
Lymphs: 26 %
MCH: 29.9 pg (ref 26.6–33.0)
MCHC: 32.5 g/dL (ref 31.5–35.7)
MCV: 92 fL (ref 79–97)
Monocytes Absolute: 0.5 10*3/uL (ref 0.1–0.9)
Monocytes: 11 %
Neutrophils Absolute: 2.7 10*3/uL (ref 1.4–7.0)
Neutrophils: 57 %
Platelets: 196 10*3/uL (ref 150–450)
RBC: 4.58 x10E6/uL (ref 4.14–5.80)
RDW: 13.6 % (ref 11.6–15.4)
WBC: 4.8 10*3/uL (ref 3.4–10.8)

## 2022-05-17 LAB — LIPID PANEL W/O CHOL/HDL RATIO
Cholesterol, Total: 108 mg/dL (ref 100–199)
HDL: 48 mg/dL (ref >39)
LDL Chol Calc (NIH): 49 mg/dL (ref 0–99)
Triglycerides: 39 mg/dL (ref 0–149)
VLDL Cholesterol Cal: 11 mg/dL (ref 5–40)

## 2022-05-17 LAB — HGB A1C W/O EAG: Hgb A1c MFr Bld: 6.5 % — ABNORMAL HIGH (ref 4.8–5.6)

## 2022-05-17 LAB — VITAMIN D 25 HYDROXY (VIT D DEFICIENCY, FRACTURES): Vit D, 25-Hydroxy: 45 ng/mL (ref 30.0–100.0)

## 2022-05-24 ENCOUNTER — Ambulatory Visit (INDEPENDENT_AMBULATORY_CARE_PROVIDER_SITE_OTHER): Payer: Medicare Other | Admitting: Internal Medicine

## 2022-05-24 ENCOUNTER — Encounter: Payer: Self-pay | Admitting: Internal Medicine

## 2022-05-24 VITALS — BP 138/68 | HR 85 | Temp 97.0°F | Ht 70.0 in | Wt 223.0 lb

## 2022-05-24 DIAGNOSIS — I10 Essential (primary) hypertension: Secondary | ICD-10-CM | POA: Diagnosis not present

## 2022-05-24 DIAGNOSIS — E119 Type 2 diabetes mellitus without complications: Secondary | ICD-10-CM

## 2022-05-24 DIAGNOSIS — C61 Malignant neoplasm of prostate: Secondary | ICD-10-CM

## 2022-05-24 DIAGNOSIS — Z1331 Encounter for screening for depression: Secondary | ICD-10-CM

## 2022-05-24 DIAGNOSIS — E782 Mixed hyperlipidemia: Secondary | ICD-10-CM

## 2022-05-24 DIAGNOSIS — Z0001 Encounter for general adult medical examination with abnormal findings: Secondary | ICD-10-CM | POA: Diagnosis not present

## 2022-05-24 LAB — POCT CBG (FASTING - GLUCOSE)-MANUAL ENTRY: Glucose Fasting, POC: 129 mg/dL — AB (ref 70–99)

## 2022-05-24 MED ORDER — DAPAGLIFLOZIN PROPANEDIOL 10 MG PO TABS: 10.0000 mg | ORAL_TABLET | Freq: Every day | ORAL | 0 refills | Status: DC

## 2022-05-24 MED ORDER — METFORMIN HCL ER (MOD) 500 MG PO TB24: 1000.0000 mg | ORAL_TABLET | Freq: Two times a day (BID) | ORAL | 0 refills | Status: DC

## 2022-05-24 MED ORDER — OLMESARTAN-AMLODIPINE-HCTZ 40-10-12.5 MG PO TABS: ORAL_TABLET | ORAL | 0 refills | Status: DC

## 2022-05-24 NOTE — Progress Notes (Signed)
Established Patient Office Visit  Subjective:  Patient ID: Jose Randolph, male    DOB: September 10, 1950  Age: 72 y.o. MRN: [id number]  Chief Complaint  Patient presents with   Annual Exam    AWV    No new complaints, here for AWV and refer to scanned documents.  Labs reviewed and notable for well controlled diabetes, LDL at target with unremarkable CMP and Vit D level.     No other concerns at this time.   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 Excised, margin involved.    Past Surgical History:  Procedure Laterality Date   COLONOSCOPY WITH PROPOFOL N/A 10/28/2019   Procedure: COLONOSCOPY WITH PROPOFOL;  Surgeon: [name], MD;  Location: [facility] ENDOSCOPY;  Randolph: Endoscopy;  Laterality: N/A;   FRACTURE SURGERY      Social History   Socioeconomic History   Marital status: Married    Spouse name: Not on file   Number of children: Not on file   Years of education: Not on file   Highest education level: Not on file  Occupational History   Not on file  Tobacco Use   Smoking status: Former    Packs/day: 0.50    Years: 5.00    Additional pack years: 0.00    Total pack years: 2.50    Types: Cigarettes    Quit date: 03/07/1983    Years since quitting: 39.2   Smokeless tobacco: Never  Vaping Use   Vaping Use: Never used  Substance and Sexual Activity   Alcohol use: Never   Drug use: Never   Sexual activity: Yes    Birth control/protection: None  Other Topics Concern   Not on file  Social History Narrative   Not on file   Social Determinants of Health   Financial Resource Strain: Not on file  Food Insecurity: Not on file  Transportation Needs: Not on file  Physical Activity: Not on file  Stress: Not on file  Social Connections: Not on file  Intimate Partner Violence: Not on file     Family History  Problem Relation Age of Onset   Colon cancer Father        dx 65s   Prostate cancer Brother 63   Prostate cancer Brother 66   Kidney cancer Brother 42   Cancer Maternal Aunt        unk type   Lung cancer Maternal Uncle    Leukemia Grandson 2    Allergies  Allergen Reactions   Oxycodone    Bee Pollen Itching    Review of Systems  Constitutional: Negative.   HENT: Negative.    Eyes: Negative.   Respiratory: Negative.    Cardiovascular: Negative.   Gastrointestinal: Negative.   Genitourinary: Negative.   Skin: Negative.   Neurological: Negative.   Endo/Heme/Allergies: Negative.        Objective:   BP 138/68   Pulse 85   Temp (!) 97 F (36.1 C) (Tympanic)   Ht 5\' 10"  (1.778 m)   Wt 223 lb (101.2 kg)   SpO2 95%   BMI 32.00 kg/m   Vitals:   05/24/22 1048  BP: 138/68  Pulse: 85  Temp: (!) 97 F (36.1 C)  Height: 5\' 10"  (1.778 m)  Weight: 223 lb (101.2 kg)  SpO2: 95%  TempSrc:  Tympanic  BMI (Calculated): 32    Physical Exam Vitals reviewed.  Constitutional:      Appearance: Normal appearance.  HENT:     Head: Normocephalic.     Left Ear: There is no impacted cerumen.     Nose: Nose normal.     Mouth/Throat:     Mouth: Mucous membranes are moist.     Pharynx: No posterior oropharyngeal erythema.  Eyes:     Extraocular Movements: Extraocular movements intact.     Pupils: Pupils are equal, round, and reactive to light.  Cardiovascular:     Rate and Rhythm: Regular rhythm.     Chest Wall: PMI is not displaced.     Pulses: Normal pulses.     Heart sounds: Normal heart sounds. No murmur heard. Pulmonary:     Effort: Pulmonary effort is normal.     Breath sounds: Normal air entry. No rhonchi or rales.  Abdominal:     General: Abdomen is flat. Bowel sounds are normal. There is no distension.     Palpations: Abdomen is soft. There is no hepatomegaly, splenomegaly or mass.     Tenderness: There is no abdominal tenderness.   Musculoskeletal:        General: Normal range of motion.     Cervical back: Normal range of motion and neck supple.     Right lower leg: No edema.     Left lower leg: No edema.  Skin:    General: Skin is warm and dry.  Neurological:     General: No focal deficit present.     Mental Status: He is alert and oriented to person, place, and time.     Cranial Nerves: No cranial nerve deficit.     Motor: No weakness.  Psychiatric:        Mood and Affect: Mood normal.        Behavior: Behavior normal.      Results for orders placed or performed in visit on 05/24/22  POCT CBG (Fasting - Glucose)  Result Value Ref Range   Glucose Fasting, POC 129 (A) 70 - 99 mg/dL    Recent Results (from the past 2160 hour(Laiza Veenstra))  PSA     Status: None   Collection Time: 04/03/22 10:40 AM  Result Value Ref Range   Prostatic Specific Antigen 1.55 0.00 - 4.00 ng/mL    Comment: (NOTE) While PSA levels of <=4.00 ng/ml are reported as reference range, some men with levels below 4.00 ng/ml can have prostate cancer and many men with PSA above 4.00 ng/ml do not have prostate cancer.  Other tests such as free PSA, age specific reference ranges, PSA velocity and PSA doubling time may be helpful especially in men less than 13 years old. Performed at [facility], [place] [address], [place], [place] [address]   Comprehensive metabolic panel     Status: Abnormal   Collection Time: 04/03/22 10:40 AM  Result Value Ref Range   Sodium 140 135 - 145 mmol/L   Potassium 3.4 (L) 3.5 - 5.1 mmol/L   Chloride 105 98 - 111 mmol/L   CO2 25 22 - 32 mmol/L   Glucose, Bld 119 (H) 70 - 99 mg/dL    Comment: Glucose reference range applies only to samples taken after fasting for at least 8 hours.   BUN 14 8 - 23 mg/dL   Creatinine, Ser 0.79 0.61 - 1.24 mg/dL   Calcium 9.3 8.9 - 10.3 mg/dL   Total Protein 7.7 6.5 -  8.1 g/dL   Albumin 4.1 3.5 - 5.0 g/dL   AST 20 15 - 41 U/L   ALT 14 0 - 44 U/L   Alkaline Phosphatase 59  38 - 126 U/L   Total Bilirubin 1.2 0.3 - 1.2 mg/dL   GFR, Estimated >60 >60 mL/min    Comment: (NOTE) Calculated using the CKD-EPI Creatinine Equation (2021)    Anion gap 10 5 - 15    Comment: Performed at [facility], [address] [place], [place] [address]  CBC with Differential/Platelet     Status: None   Collection Time: 04/03/22 10:40 AM  Result Value Ref Range   WBC 5.4 4.0 - 10.5 K/uL   RBC 4.41 4.22 - 5.81 MIL/uL   Hemoglobin 13.4 13.0 - 17.0 g/dL   HCT 39.6 39.0 - 52.0 %   MCV 89.8 80.0 - 100.0 fL   MCH 30.4 26.0 - 34.0 pg   MCHC 33.8 30.0 - 36.0 g/dL   RDW 14.0 11.5 - 15.5 %   Platelets 190 150 - 400 K/uL   nRBC 0.0 0.0 - 0.2 %   Neutrophils Relative % 57 %   Neutro Abs 3.1 1.7 - 7.7 K/uL   Lymphocytes Relative 28 %   Lymphs Abs 1.5 0.7 - 4.0 K/uL   Monocytes Relative 10 %   Monocytes Absolute 0.5 0.1 - 1.0 K/uL   Eosinophils Relative 4 %   Eosinophils Absolute 0.2 0.0 - 0.5 K/uL   Basophils Relative 1 %   Basophils Absolute 0.0 0.0 - 0.1 K/uL   Immature Granulocytes 0 %   Abs Immature Granulocytes 0.02 0.00 - 0.07 K/uL    Comment: Performed at [facility], [address] [place], [place] [address]  CBC with Differential/Platelet     Status: None   Collection Time: 05/16/22  8:41 AM  Result Value Ref Range   WBC 4.8 3.4 - 10.8 x10E3/uL   RBC 4.58 4.14 - 5.80 x10E6/uL   Hemoglobin 13.7 13.0 - 17.7 g/dL   Hematocrit 42.1 37.5 - 51.0 %   MCV 92 79 - 97 fL   MCH 29.9 26.6 - 33.0 pg   MCHC 32.5 31.5 - 35.7 g/dL   RDW 13.6 11.6 - 15.4 %   Platelets 196 150 - 450 x10E3/uL   Neutrophils 57 Not Estab. %   Lymphs 26 Not Estab. %   Monocytes 11 Not Estab. %   Eos 5 Not Estab. %   Basos 1 Not Estab. %   Neutrophils Absolute 2.7 1.4 - 7.0 x10E3/uL   Lymphocytes Absolute 1.2 0.7 - 3.1 x10E3/uL   Monocytes Absolute 0.5 0.1 - 0.9 x10E3/uL   EOS (ABSOLUTE) 0.2 0.0 - 0.4 x10E3/uL   Basophils Absolute 0.1 0.0 - 0.2 x10E3/uL   Immature Granulocytes 0  Not Estab. %   Immature Grans (Abs) 0.0 0.0 - 0.1 x10E3/uL  Comprehensive metabolic panel     Status: Abnormal   Collection Time: 05/16/22  8:41 AM  Result Value Ref Range   Glucose 110 (H) 70 - 99 mg/dL   BUN 9 8 - 27 mg/dL   Creatinine, Ser 0.69 (L) 0.76 - 1.27 mg/dL   eGFR 100 >59 mL/min/1.73   BUN/Creatinine Ratio 13 10 - 24   Sodium 144 134 - 144 mmol/L   Potassium 4.2 3.5 - 5.2 mmol/L   Chloride 106 96 - 106 mmol/L   CO2 23 20 - 29 mmol/L   Calcium 9.9 8.6 - 10.2 mg/dL   Total  Protein 7.0 6.0 - 8.5 g/dL   Albumin 4.3 3.9 - 4.9 g/dL   Globulin, Total 2.7 1.5 - 4.5 g/dL   Albumin/Globulin Ratio 1.6 1.2 - 2.2   Bilirubin Total 0.6 0.0 - 1.2 mg/dL   Alkaline Phosphatase 75 44 - 121 IU/L   AST 13 0 - 40 IU/L   ALT 10 0 - 44 IU/L  Lipid Panel w/o Chol/HDL Ratio     Status: None   Collection Time: 05/16/22  8:41 AM  Result Value Ref Range   Cholesterol, Total 108 100 - 199 mg/dL   Triglycerides 39 0 - 149 mg/dL   HDL 48 >39 mg/dL   VLDL Cholesterol Cal 11 5 - 40 mg/dL   LDL Chol Calc (NIH) 49 0 - 99 mg/dL  Hgb A1c w/o eAG     Status: Abnormal   Collection Time: 05/16/22  8:41 AM  Result Value Ref Range   Hgb A1c MFr Bld 6.5 (H) 4.8 - 5.6 %    Comment:          Prediabetes: 5.7 - 6.4          Diabetes: >6.4          Glycemic control for adults with diabetes: <7.0   VITAMIN D 25 Hydroxy (Vit-D Deficiency, Fractures)     Status: None   Collection Time: 05/16/22  8:41 AM  Result Value Ref Range   Vit D, 25-Hydroxy 45.0 30.0 - 100.0 ng/mL    Comment: Vitamin D deficiency has been defined by the [place] and an Endocrine Society practice guideline as a level of serum 25-OH vitamin D less than 20 ng/mL (1,2). The Endocrine Society went on to further define vitamin D insufficiency as a level between 21 and 29 ng/mL (2). 1. IOM (Institute of Medicine). 2010. Dietary reference    intakes for calcium and D. [place]: The    [employer]. 2. Holick  MF, Binkley [place], Bischoff-Ferrari HA, et al.    Evaluation, treatment, and prevention of vitamin D    deficiency: an Endocrine Society clinical practice    guideline. JCEM. 2011 Jul; 96(7):1911-30.   POCT CBG (Fasting - Glucose)     Status: Abnormal   Collection Time: 05/24/22 10:55 AM  Result Value Ref Range   Glucose Fasting, POC 129 (A) 70 - 99 mg/dL      Assessment & Plan:   Problem List Items Addressed This Visit   None Visit Diagnoses     Type 2 diabetes mellitus without complication, without long-term current use of insulin (HCC)    -  Primary   Relevant Orders   POCT CBG (Fasting - Glucose) (Completed)       No follow-ups on file.   Total time spent: 30 minutes  Volanda Napoleon, MD  05/24/2022

## 2022-05-26 ENCOUNTER — Encounter: Payer: Self-pay | Admitting: Internal Medicine

## 2022-06-14 ENCOUNTER — Other Ambulatory Visit: Payer: Medicare Other

## 2022-06-21 ENCOUNTER — Other Ambulatory Visit: Payer: Medicare Other

## 2022-06-22 ENCOUNTER — Ambulatory Visit: Payer: Medicare Other | Admitting: Radiation Oncology

## 2022-07-04 ENCOUNTER — Inpatient Hospital Stay: Payer: Medicare Other | Attending: Oncology

## 2022-07-04 DIAGNOSIS — C61 Malignant neoplasm of prostate: Secondary | ICD-10-CM | POA: Diagnosis present

## 2022-07-04 LAB — COMPREHENSIVE METABOLIC PANEL
ALT: 16 U/L (ref 0–44)
AST: 20 U/L (ref 15–41)
Albumin: 4.2 g/dL (ref 3.5–5.0)
Alkaline Phosphatase: 60 U/L (ref 38–126)
Anion gap: 7 (ref 5–15)
BUN: 12 mg/dL (ref 8–23)
CO2: 27 mmol/L (ref 22–32)
Calcium: 9.2 mg/dL (ref 8.9–10.3)
Chloride: 105 mmol/L (ref 98–111)
Creatinine, Ser: 0.64 mg/dL (ref 0.61–1.24)
GFR, Estimated: >60 mL/min (ref >60)
Glucose, Bld: 145 mg/dL — ABNORMAL HIGH (ref 70–99)
Potassium: 3.5 mmol/L (ref 3.5–5.1)
Sodium: 139 mmol/L (ref 135–145)
Total Bilirubin: 1.1 mg/dL (ref 0.3–1.2)
Total Protein: 7.5 g/dL (ref 6.5–8.1)

## 2022-07-04 LAB — CBC WITH DIFFERENTIAL/PLATELET
Abs Immature Granulocytes: 0.01 10*3/uL (ref 0.00–0.07)
Basophils Absolute: 0.1 10*3/uL (ref 0.0–0.1)
Basophils Relative: 1 %
Eosinophils Absolute: 0.2 10*3/uL (ref 0.0–0.5)
Eosinophils Relative: 4 %
HCT: 40.7 % (ref 39.0–52.0)
Hemoglobin: 13.8 g/dL (ref 13.0–17.0)
Immature Granulocytes: 0 %
Lymphocytes Relative: 34 %
Lymphs Abs: 1.6 10*3/uL (ref 0.7–4.0)
MCH: 30.4 pg (ref 26.0–34.0)
MCHC: 33.9 g/dL (ref 30.0–36.0)
MCV: 89.6 fL (ref 80.0–100.0)
Monocytes Absolute: 0.6 10*3/uL (ref 0.1–1.0)
Monocytes Relative: 11 %
Neutro Abs: 2.4 10*3/uL (ref 1.7–7.7)
Neutrophils Relative %: 50 %
Platelets: 182 10*3/uL (ref 150–400)
RBC: 4.54 MIL/uL (ref 4.22–5.81)
RDW: 14.1 % (ref 11.5–15.5)
WBC: 4.8 10*3/uL (ref 4.0–10.5)
nRBC: 0 % (ref 0.0–0.2)

## 2022-07-04 LAB — PSA: Prostatic Specific Antigen: 0.94 ng/mL (ref 0.00–4.00)

## 2022-07-10 ENCOUNTER — Telehealth: Payer: Self-pay

## 2022-07-10 NOTE — Telephone Encounter (Signed)
Patient wife called asking for call back about an rx ,

## 2022-07-11 ENCOUNTER — Inpatient Hospital Stay: Payer: Medicare Other | Attending: Oncology | Admitting: Oncology

## 2022-07-11 ENCOUNTER — Encounter: Payer: Self-pay | Admitting: Oncology

## 2022-07-11 ENCOUNTER — Ambulatory Visit
Admission: RE | Admit: 2022-07-11 | Discharge: 2022-07-11 | Disposition: A | Discharge status: Home / Self Care | Payer: Medicare Other | Source: Ambulatory Visit | Attending: Radiation Oncology | Admitting: Radiation Oncology

## 2022-07-11 ENCOUNTER — Inpatient Hospital Stay: Payer: Medicare Other

## 2022-07-11 ENCOUNTER — Other Ambulatory Visit: Payer: Self-pay | Admitting: Internal Medicine

## 2022-07-11 VITALS — BP 130/76 | HR 74 | Temp 96.0°F | Resp 18 | Wt 220.8 lb

## 2022-07-11 DIAGNOSIS — Z806 Family history of leukemia: Secondary | ICD-10-CM | POA: Diagnosis not present

## 2022-07-11 DIAGNOSIS — Z8051 Family history of malignant neoplasm of kidney: Secondary | ICD-10-CM | POA: Diagnosis not present

## 2022-07-11 DIAGNOSIS — Z801 Family history of malignant neoplasm of trachea, bronchus and lung: Secondary | ICD-10-CM | POA: Insufficient documentation

## 2022-07-11 DIAGNOSIS — Z87891 Personal history of nicotine dependence: Secondary | ICD-10-CM | POA: Diagnosis not present

## 2022-07-11 DIAGNOSIS — Z8042 Family history of malignant neoplasm of prostate: Secondary | ICD-10-CM | POA: Diagnosis not present

## 2022-07-11 DIAGNOSIS — C61 Malignant neoplasm of prostate: Secondary | ICD-10-CM | POA: Diagnosis present

## 2022-07-11 DIAGNOSIS — Z923 Personal history of irradiation: Secondary | ICD-10-CM | POA: Insufficient documentation

## 2022-07-11 DIAGNOSIS — E119 Type 2 diabetes mellitus without complications: Secondary | ICD-10-CM

## 2022-07-11 MED ORDER — METFORMIN HCL ER 500 MG PO TB24: 2000.0000 mg | ORAL_TABLET | Freq: Every day | ORAL | 1 refills | Status: DC

## 2022-07-11 NOTE — Assessment & Plan Note (Addendum)
Prostate cancer status post definitive radiation.   PSA trending up due to biochemical recurrence, increase of 2 ng/ ml above nadir.  Interval between radiation and recurrence less than 18 months.  High risk. PSMA PET scan showed nonspecific prostate activity.  No definitive radiographic evidence of recurrence. PSA continues to decrease despite any intervention. The transient increase of PSA is likely due to inflammation.  Continue observation.

## 2022-07-11 NOTE — Progress Notes (Signed)
Hematology/Oncology Consult Note [phone] [phone]     REFERRING PROVIDER: [name], MD   CHIEF COMPLAINTS/PURPOSE OF CONSULTATION:  Prostate cancer  ASSESSMENT & PLAN:   Cancer Staging  Prostate cancer [facility]) Staging form: Prostate, AJCC 8th Edition - Clinical stage from 04/21/2015: Stage IIB (cT1, cN0, cM0, PSA: 10, Grade Group: 2) - Signed by Rickard Patience, MD on 12/20/2021   Prostate cancer [facility]) Prostate cancer status post definitive radiation.   PSA trending up due to biochemical recurrence, increase of 2 ng/ ml above nadir.  Interval between radiation and recurrence less than 18 months.  High risk. PSMA PET scan showed nonspecific prostate activity.  No definitive radiographic evidence of recurrence. PSA continues to decrease despite any intervention. The transient increase of PSA is likely due to inflammation.  Continue observation.   Orders Placed This Encounter  Procedures   CMP (Cancer Center only)    Standing Status:   Future    Standing Expiration Date:   07/11/2023   CBC with Differential (Cancer Center Only)    Standing Status:   Future    Standing Expiration Date:   07/11/2023   PSA    Standing Status:   Future    Standing Expiration Date:   07/11/2023   Follow-up in 6 months. All questions were answered. The patient knows to call the clinic with any problems, questions or concerns.  Rickard Patience, MD, PhD [facility] Hematology Oncology 07/11/2022       HISTORY OF PRESENTING ILLNESS:  Jose Randolph 72 y.o. male presents to establish care for  I have reviewed his chart and materials related to his cancer extensively and collaborated history with the patient. Summary of oncologic history is as follows: Oncology History  Prostate cancer (HCC)  04/21/2015 Cancer Staging   Staging form: Prostate, AJCC 8th Edition - Clinical stage from 04/21/2015: Stage IIB (cT1, cN0, cM0, PSA: 10, Grade Group: 2) - Signed by Rickard Patience, MD on  12/20/2021 Stage prefix: Initial diagnosis Prostate specific antigen (PSA) range: 10 to 19 Gleason score: 7 Histologic grading system: 5 grade system   04/21/2015 Initial Diagnosis   Prostate cancer   -Per urologist who has obtained previous records, prostate biopsy 04/21/2015 which did show a focus of Gleason 3+3 adenocarcinoma in the left mid core involving 5% of the submitted tissue -Initiated patient elected active surveillance. -MRI prostate in 2017 showed no suspicious lesion in the prostate volume of 79 cc. -April 2018 biopsy showed benign tissue and a focus of atypical glands suspicious for adenocarcinoma right base.  PSA was elevated at 4.99     05/13/2018 Procedure   Per Urology note MR fusion biopsy [facility] 05/12/2020; prostate volume 72 g ROI biopsies x7 taken all showing benign prostate tissue LML core + Gleason 4+3 adenocarcinoma involving 15% of the submitted tissue LAL core + Gleason 3+3 adenocarcinoma involving 5% of submitted tissue Remaining biopsies with benign prostate tissue   03/31/2020 Tumor Marker   PSA 10.2.   04/14/2020 Imaging   MRI prostate with and without contrast -1. PI-RADS category 4 lesion of the left anterior peripheral zone in the mid gland and apex. 2. PI-RADS category 3 lesion of the right anterior and posterolateral peripheral zone at the apex. 3. Targeting data sent to [facility]. 4. Benign prostatic hypertrophy, prostate volume 102.85 cubic cm   06/16/2020 - 08/11/2020 Radiation Therapy   Prostate radiation   12/06/2020 Tumor Marker   PSA 0.11   06/06/2021 Tumor Marker   PSA  0.74   12/12/2021 Tumor Marker   PSA  2.33.   12/27/2021 Imaging   PSMA PET scan pending   01/31/2022 Genetic Testing   Negative genetic testing. No pathogenic variants identified on the [facility] Common Hereditary Cancers+RNA panel. The report date is 01/27/2022.  The Common Hereditary Cancers Panel + RNA offered by [facility] includes sequencing and/or deletion  duplication testing of the following 48 genes: APC*, ATM*, AXIN2, BAP1, BARD1, BMPR1A, BRCA1, BRCA2, BRIP1, CDH1, CDK4, CDKN2A (p14ARF), CDKN2A (p16INK4a), CHEK2, CTNNA1, DICER1*, EPCAM*, FH*, GREM1*, HOXB13, KIT, MBD4, MEN1*, MLH1*, MSH2*, MSH3*, MSH6*, MUTYH, NF1*, NTHL1, PALB2, PDGFRA, PMS2*, POLD1*, POLE, PTEN*, RAD51C, RAD51D, SDHA*, SDHB, SDHC*, SDHD, SMAD4, SMARCA4, STK11, TP53, TSC1*, TSC2, VHL.    04/03/2022 Tumor Marker   PSA 1.55    Patient was referred to establish care with medical oncology due to increase of PSA level. Patient was accompanied by wife.  Patient denies  urinary urgency, dysuria.  Occasionally patient has loose bowel movements, manageable with Imodium as needed.  Patient attributes to metformin use.  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 Constance Goltz and is currently on phototherapy. Patient reported family history of prostate cancer and colon cancer.    [name] who has above history reviewed by me today presents for follow up visit for prostate cancer.  He denies any new complaints.   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 Excised, margin involved.    SURGICAL HISTORY: Past Surgical History:  Procedure Laterality Date   COLONOSCOPY WITH PROPOFOL N/A 10/28/2019   Procedure: COLONOSCOPY WITH PROPOFOL;  Surgeon: Regis Bill, MD;  Location: [facility] ENDOSCOPY;  Service: Endoscopy;  Laterality: N/A;   FRACTURE SURGERY      SOCIAL HISTORY: Social History   Socioeconomic History   Marital status: Married    Spouse name: Not on file   Number of children: Not on file   Years of education: Not on file   Highest education level: Not on file  Occupational History   Not on file   Tobacco Use   Smoking status: Former    Packs/day: 0.50    Years: 5.00    Additional pack years: 0.00    Total pack years: 2.50    Types: Cigarettes    Quit date: 03/07/1983    Years since quitting: 39.3   Smokeless tobacco: Never  Vaping Use   Vaping Use: Never used  Substance and Sexual Activity   Alcohol use: Never   Drug use: Never   Sexual activity: Yes    Birth control/protection: None  Other Topics Concern   Not on file  Social History Narrative   Not on file   Social Determinants of Health   Financial Resource Strain: Not on file  Food Insecurity: Not on file  Transportation Needs: Not on file  Physical Activity: Not on file  Stress: Not on file  Social Connections: Not on file  Intimate Partner Violence: Not on file    FAMILY HISTORY: Family History  Problem Relation Age of Onset   Colon cancer Father        dx 30s   Prostate cancer Brother 6   Prostate cancer Brother 63   Kidney cancer Brother 47  Cancer Maternal Aunt        unk type   Lung cancer Maternal Uncle    Leukemia Grandson 2    ALLERGIES:  is allergic to oxycodone and bee pollen.  MEDICATIONS:  Current Outpatient Medications  Medication Sig Dispense Refill   atorvastatin (LIPITOR) 40 MG tablet Take 40 mg by mouth daily.     carvedilol (COREG CR) 20 MG 24 hr capsule TAKE 1 CAPSULE BY MOUTH EVERY DAY IN THE MORNING 90 capsule 1   Cholecalciferol 25 MCG (1000 UT) tablet Take by mouth.     dapagliflozin propanediol (FARXIGA) 10 MG TABS tablet Take 1 tablet (10 mg total) by mouth daily before breakfast. 90 tablet 0   halobetasol (ULTRAVATE) 0.05 % ointment Apply topically.     Lancets (ONETOUCH ULTRASOFT) lancets USE WITH DEVICE TO CHECK SUGARS TWICE DAILY DX E11.9     metFORMIN (GLUMETZA) 500 MG (MOD) 24 hr tablet Take 2 tablets (1,000 mg total) by mouth 2 (two) times daily with a meal. 360 tablet 0   Olmesartan-amLODIPine-HCTZ 40-10-12.5 MG TABS TAKE 1 TABLET BY MOUTH EVERY DAY IN THE  MORNING 90 tablet 0   ONETOUCH ULTRA test strip 2 (two) times daily.     tadalafil (CIALIS) 20 MG tablet EVERY 36 HOURS AS NEEDED FOR ED.     tamsulosin (FLOMAX) 0.4 MG CAPS capsule Take 1 capsule (0.4 mg total) by mouth 2 (two) times daily. 180 capsule 3   Mechlorethamine HCl (VALCHLOR) 0.016 % GEL Apply topically. (Patient not taking: Reported on 05/24/2022)     No current facility-administered medications for this visit.    Review of Systems  Constitutional:  Negative for appetite change, chills, fatigue, fever and unexpected weight change.  HENT:   Negative for hearing loss and voice change.   Eyes:  Negative for eye problems and icterus.  Respiratory:  Negative for chest tightness, cough and shortness of breath.   Cardiovascular:  Negative for chest pain and leg swelling.  Gastrointestinal:  Negative for abdominal distention, abdominal pain and diarrhea.  Endocrine: Negative for hot flashes.  Genitourinary:  Negative for difficulty urinating, dysuria and frequency.   Musculoskeletal:  Negative for arthralgias.  Skin:  Negative for itching and rash.  Neurological:  Negative for light-headedness and numbness.  Hematological:  Negative for adenopathy. Does not bruise/bleed easily.  Psychiatric/Behavioral:  Negative for confusion.      PHYSICAL EXAMINATION: ECOG PERFORMANCE STATUS: 1 - Symptomatic but completely ambulatory  Vitals:   07/11/22 0949  BP: 130/76  Pulse: 74  Resp: 18  Temp: (!) 96 F (35.6 C)  SpO2: 100%   Filed Weights   07/11/22 0949  Weight: 220 lb 12.8 oz (100.2 kg)    Physical Exam Constitutional:      General: He is not in acute distress.    Appearance: He is not diaphoretic.  HENT:     Head: Normocephalic and atraumatic.     Nose: Nose normal.     Mouth/Throat:     Pharynx: No oropharyngeal exudate.  Eyes:     General: No scleral icterus.    Pupils: Pupils are equal, round, and reactive to light.  Cardiovascular:     Rate and Rhythm: Normal  rate and regular rhythm.     Heart sounds: No murmur heard. Pulmonary:     Effort: Pulmonary effort is normal. No respiratory distress.     Breath sounds: No rales.  Chest:     Chest wall: No tenderness.  Abdominal:  General: There is no distension.     Palpations: Abdomen is soft.     Tenderness: There is no abdominal tenderness.  Musculoskeletal:        General: Normal range of motion.     Cervical back: Normal range of motion and neck supple.  Skin:    General: Skin is warm and dry.     Findings: No erythema.  Neurological:     Mental Status: He is alert and oriented to person, place, and time.     Cranial Nerves: No cranial nerve deficit.     Motor: No abnormal muscle tone.     Coordination: Coordination normal.  Psychiatric:        Mood and Affect: Affect normal.      LABORATORY DATA:  I have reviewed the data as listed    Latest Ref Rng & Units 07/04/2022    7:57 AM 05/16/2022    8:41 AM 04/03/2022   10:40 AM  CBC  WBC 4.0 - 10.5 K/uL 4.8  4.8  5.4   Hemoglobin 13.0 - 17.0 g/dL 16.1  09.6  04.5   Hematocrit 39.0 - 52.0 % 40.7  42.1  39.6   Platelets 150 - 400 K/uL 182  196  190       Latest Ref Rng & Units 07/04/2022    7:57 AM 05/16/2022    8:41 AM 04/03/2022   10:40 AM  CMP  Glucose 70 - 99 mg/dL 409  811  914   BUN 8 - 23 mg/dL 12  9  14    Creatinine 0.61 - 1.24 mg/dL 7.82  9.56  2.13   Sodium 135 - 145 mmol/L 139  144  140   Potassium 3.5 - 5.1 mmol/L 3.5  4.2  3.4   Chloride 98 - 111 mmol/L 105  106  105   CO2 22 - 32 mmol/L 27  23  25    Calcium 8.9 - 10.3 mg/dL 9.2  9.9  9.3   Total Protein 6.5 - 8.1 g/dL 7.5  7.0  7.7   Total Bilirubin 0.3 - 1.2 mg/dL 1.1  0.6  1.2   Alkaline Phos 38 - 126 U/L 60  75  59   AST 15 - 41 U/L 20  13  20    ALT 0 - 44 U/L 16  10  14       RADIOGRAPHIC STUDIES: I have personally reviewed the radiological images as listed and agreed with the findings in the report. No results found.

## 2022-07-11 NOTE — Progress Notes (Signed)
Radiation Oncology Follow up Note  Name: Jose Randolph   Date:   07/11/2022 MRN:  [id number] DOB: 03/11/1950    This 72 y.o. male presents to the clinic today for 57-month follow-up status post image guided IMRT radiation therapy for Gleason 7 (4+3) adenocarcinoma presenting with a PSA in the 10 range.  REFERRING PROVIDER: [name], MD  HPI: Patient is a 72 year old male now out 22 months having completed image guided IMRT radiation therapy for Gleason 7 adenocarcinoma the prostate seen today in routine follow-up he is doing well specifically denies any increased lower urinary tract symptoms diarrhea or fatigue he had a bump in his PSA.  Up to 1.55.  I referred him to medical oncology did they did not start ADT therapy.  He had a PSMA PET scan which showed no evidence of gross residual disease.  His most recent PSA has again decreased to 0.94  COMPLICATIONS OF TREATMENT: none  FOLLOW UP COMPLIANCE: keeps appointments   PHYSICAL EXAM:  There were no vitals taken for this visit. Well-developed well-nourished patient in NAD. HEENT reveals PERLA, EOMI, discs not visualized.  Oral cavity is clear. No oral mucosal lesions are identified. Neck is clear without evidence of cervical or supraclavicular adenopathy. Lungs are clear to A&P. Cardiac examination is essentially unremarkable with regular rate and rhythm without murmur rub or thrill. Abdomen is benign with no organomegaly or masses noted. Motor sensory and DTR levels are equal and symmetric in the upper and lower extremities. Cranial nerves II through XII are grossly intact. Proprioception is intact. No peripheral adenopathy or edema is identified. No motor or sensory levels are noted. Crude visual fields are within normal range.  RADIOLOGY RESULTS: PSMA PET scan reviewed  PLAN: At the present time patient is doing well I am pleased to see a decline without intervention with ADT therapy of his PSA.  I am pleased with his overall  progress.  I have asked to see him back in 6 months for follow-up.  He continues follow-up care with Dr. [name].  I would like to take this opportunity to thank you for allowing me to participate in the care of your patient.Carmina Miller, MD

## 2022-07-13 ENCOUNTER — Other Ambulatory Visit: Payer: Self-pay | Admitting: Internal Medicine

## 2022-07-13 DIAGNOSIS — I10 Essential (primary) hypertension: Secondary | ICD-10-CM

## 2022-07-28 ENCOUNTER — Other Ambulatory Visit: Payer: Self-pay | Admitting: Internal Medicine

## 2022-07-28 DIAGNOSIS — E119 Type 2 diabetes mellitus without complications: Secondary | ICD-10-CM

## 2022-08-25 ENCOUNTER — Other Ambulatory Visit: Payer: Self-pay | Admitting: Internal Medicine

## 2022-08-25 DIAGNOSIS — E119 Type 2 diabetes mellitus without complications: Secondary | ICD-10-CM

## 2022-08-28 ENCOUNTER — Ambulatory Visit: Payer: Medicare Other | Admitting: Internal Medicine

## 2022-08-29 ENCOUNTER — Other Ambulatory Visit: Payer: Medicare Other

## 2022-08-29 DIAGNOSIS — E119 Type 2 diabetes mellitus without complications: Secondary | ICD-10-CM

## 2022-08-29 DIAGNOSIS — C61 Malignant neoplasm of prostate: Secondary | ICD-10-CM

## 2022-08-29 DIAGNOSIS — E782 Mixed hyperlipidemia: Secondary | ICD-10-CM

## 2022-08-30 LAB — HEMOGLOBIN A1C
Est. average glucose Bld gHb Est-mCnc: 148 mg/dL
Hgb A1c MFr Bld: 6.8 % — ABNORMAL HIGH (ref 4.8–5.6)

## 2022-08-30 LAB — COMPREHENSIVE METABOLIC PANEL
ALT: 10 IU/L (ref 0–44)
AST: 11 IU/L (ref 0–40)
Albumin: 4.3 g/dL (ref 3.9–4.9)
Alkaline Phosphatase: 71 IU/L (ref 44–121)
BUN/Creatinine Ratio: 17 (ref 10–24)
BUN: 12 mg/dL (ref 8–27)
Bilirubin Total: 0.7 mg/dL (ref 0.0–1.2)
CO2: 25 mmol/L (ref 20–29)
Calcium: 9.7 mg/dL (ref 8.6–10.2)
Chloride: 105 mmol/L (ref 96–106)
Creatinine, Ser: 0.7 mg/dL — ABNORMAL LOW (ref 0.76–1.27)
Globulin, Total: 2.7 g/dL (ref 1.5–4.5)
Glucose: 120 mg/dL — ABNORMAL HIGH (ref 70–99)
Potassium: 4.1 mmol/L (ref 3.5–5.2)
Sodium: 142 mmol/L (ref 134–144)
Total Protein: 7 g/dL (ref 6.0–8.5)
eGFR: 100 mL/min/{1.73_m2} (ref >59)

## 2022-08-30 LAB — LIPID PANEL
Chol/HDL Ratio: 2.2 ratio (ref 0.0–5.0)
Cholesterol, Total: 108 mg/dL (ref 100–199)
HDL: 50 mg/dL (ref >39)
LDL Chol Calc (NIH): 47 mg/dL (ref 0–99)
Triglycerides: 41 mg/dL (ref 0–149)
VLDL Cholesterol Cal: 11 mg/dL (ref 5–40)

## 2022-09-06 ENCOUNTER — Ambulatory Visit (INDEPENDENT_AMBULATORY_CARE_PROVIDER_SITE_OTHER): Payer: Medicare Other | Admitting: Internal Medicine

## 2022-09-06 VITALS — BP 128/72 | HR 70 | Ht 70.0 in | Wt 218.6 lb

## 2022-09-06 DIAGNOSIS — I10 Essential (primary) hypertension: Secondary | ICD-10-CM | POA: Insufficient documentation

## 2022-09-06 DIAGNOSIS — E119 Type 2 diabetes mellitus without complications: Secondary | ICD-10-CM | POA: Diagnosis not present

## 2022-09-06 DIAGNOSIS — C61 Malignant neoplasm of prostate: Secondary | ICD-10-CM

## 2022-09-06 DIAGNOSIS — E782 Mixed hyperlipidemia: Secondary | ICD-10-CM | POA: Diagnosis not present

## 2022-09-06 LAB — POCT CBG (FASTING - GLUCOSE)-MANUAL ENTRY: Glucose Fasting, POC: 86 mg/dL (ref 70–99)

## 2022-09-06 LAB — POC CREATINE & ALBUMIN,URINE
Albumin/Creatinine Ratio, Urine, POC: <30
Creatinine, POC: 50 mg/dL
Microalbumin Ur, POC: 10 mg/L

## 2022-09-06 MED ORDER — OLMESARTAN-AMLODIPINE-HCTZ 40-10-12.5 MG PO TABS: ORAL_TABLET | ORAL | 0 refills | Status: DC

## 2022-09-06 NOTE — Progress Notes (Signed)
Established Patient Office Visit  Subjective:  Patient ID: Jose Randolph, male    DOB: [birth year]  Age: 72 y.o. MRN: [id number]  Chief Complaint  Patient presents with   Follow-up    3 mo F/U    No new complaints, here for lab review and medication refills. Labs reviewed and notable for well controlled diabetes, A1c at target, lipids at target with unremarkable cmp. Denies any hypoglycemic episodes and home bg readings have been at target.     No other concerns at this time.   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 Excised, margin involved.    Past Surgical History:  Procedure Laterality Date   COLONOSCOPY WITH PROPOFOL N/A 10/28/2019   Procedure: COLONOSCOPY WITH PROPOFOL;  Surgeon: Regis Bill, MD;  Location: [facility] ENDOSCOPY;  Service: Endoscopy;  Laterality: N/A;   FRACTURE SURGERY      Social History   Socioeconomic History   Marital status: Married    Spouse name: Not on file   Number of children: Not on file   Years of education: Not on file   Highest education level: Not on file  Occupational History   Not on file  Tobacco Use   Smoking status: Former    Packs/day: 0.50    Years: 5.00    Additional pack years: 0.00    Total pack years: 2.50    Types: Cigarettes    Quit date: 03/07/1983    Years since quitting: 39.5   Smokeless tobacco: Never  Vaping Use   Vaping Use: Never used  Substance and Sexual Activity   Alcohol use: Never   Drug use: Never   Sexual activity: Yes    Birth control/protection: None  Other Topics Concern   Not on file  Social History Narrative   Not on file   Social Determinants of Health   Financial Resource Strain: Not on file  Food Insecurity: Not on file  Transportation Needs: Not on file  Physical Activity: Not on file  Stress: Not on file   Social Connections: Not on file  Intimate Partner Violence: Not on file    Family History  Problem Relation Age of Onset   Colon cancer Father        dx 36s   Prostate cancer Brother 63   Prostate cancer Brother 64   Kidney cancer Brother 38   Cancer Maternal Aunt        unk type   Lung cancer Maternal Uncle    Leukemia Grandson 2    Allergies  Allergen Reactions   Oxycodone    Bee Pollen Itching    Review of Systems  Constitutional: Negative.   HENT: Negative.    Eyes: Negative.   Respiratory: Negative.    Cardiovascular: Negative.   Gastrointestinal: Negative.   Genitourinary: Negative.   Skin: Negative.   Neurological: Negative.   Endo/Heme/Allergies: Negative.        Objective:   BP 128/72   Pulse 70   Ht 5\' 10"  (1.778 m)   Wt 218 lb 9.6 oz (99.2 kg)   SpO2 97%   BMI 31.37 kg/m   Vitals:   09/06/22 1046  BP: 128/72  Pulse: 70  Height: 5\' 10"  (1.778 m)  Weight: 218 lb 9.6 oz (99.2 kg)  SpO2: 97%  BMI (Calculated): 31.37  Physical Exam Vitals reviewed.  Constitutional:      Appearance: Normal appearance.  HENT:     Head: Normocephalic.     Left Ear: There is no impacted cerumen.     Nose: Nose normal.     Mouth/Throat:     Mouth: Mucous membranes are moist.     Pharynx: No posterior oropharyngeal erythema.  Eyes:     Extraocular Movements: Extraocular movements intact.     Pupils: Pupils are equal, round, and reactive to light.  Cardiovascular:     Rate and Rhythm: Regular rhythm.     Chest Wall: PMI is not displaced.     Pulses: Normal pulses.     Heart sounds: Normal heart sounds. No murmur heard. Pulmonary:     Effort: Pulmonary effort is normal.     Breath sounds: Normal air entry. No rhonchi or rales.  Abdominal:     General: Abdomen is flat. Bowel sounds are normal. There is no distension.     Palpations: Abdomen is soft. There is no hepatomegaly, splenomegaly or mass.     Tenderness: There is no abdominal tenderness.   Musculoskeletal:        General: Normal range of motion.     Cervical back: Normal range of motion and neck supple.     Right lower leg: No edema.     Left lower leg: No edema.  Skin:    General: Skin is warm and dry.  Neurological:     General: No focal deficit present.     Mental Status: He is alert and oriented to person, place, and time.     Cranial Nerves: No cranial nerve deficit.     Motor: No weakness.  Psychiatric:        Mood and Affect: Mood normal.        Behavior: Behavior normal.      Results for orders placed or performed in visit on 09/06/22  POCT CBG (Fasting - Glucose)  Result Value Ref Range   Glucose Fasting, POC 86 70 - 99 mg/dL    Recent Results (from the past 2160 hour(Toccara Alford))  PSA     Status: None   Collection Time: 07/04/22  7:57 AM  Result Value Ref Range   Prostatic Specific Antigen 0.94 0.00 - 4.00 ng/mL    Comment: (NOTE) While PSA levels of <=4.00 ng/ml are reported as reference range, some men with levels below 4.00 ng/ml can have prostate cancer and many men with PSA above 4.00 ng/ml do not have prostate cancer.  Other tests such as free PSA, age specific reference ranges, PSA velocity and PSA doubling time may be helpful especially in men less than 36 years old. Performed at [facility], [address], [place], [place] [address]   Comprehensive metabolic panel     Status: Abnormal   Collection Time: 07/04/22  7:57 AM  Result Value Ref Range   Sodium 139 135 - 145 mmol/L   Potassium 3.5 3.5 - 5.1 mmol/L   Chloride 105 98 - 111 mmol/L   CO2 27 22 - 32 mmol/L   Glucose, Bld 145 (H) 70 - 99 mg/dL    Comment: Glucose reference range applies only to samples taken after fasting for at least 8 hours.   BUN 12 8 - 23 mg/dL   Creatinine, Ser 6.04 0.61 - 1.24 mg/dL   Calcium 9.2 8.9 - 54.0 mg/dL   Total Protein 7.5 6.5 - 8.1 g/dL   Albumin 4.2 3.5 -  5.0 g/dL   AST 20 15 - 41 U/L   ALT 16 0 - 44 U/L   Alkaline Phosphatase 60 38 - 126  U/L   Total Bilirubin 1.1 0.3 - 1.2 mg/dL   GFR, Estimated >59 >56 mL/min    Comment: (NOTE) Calculated using the CKD-EPI Creatinine Equation (2021)    Anion gap 7 5 - 15    Comment: Performed at [facility], [address] [place], [place] [address]  CBC with Differential/Platelet     Status: None   Collection Time: 07/04/22  7:57 AM  Result Value Ref Range   WBC 4.8 4.0 - 10.5 K/uL   RBC 4.54 4.22 - 5.81 MIL/uL   Hemoglobin 13.8 13.0 - 17.0 g/dL   HCT 43.3 29.5 - 18.8 %   MCV 89.6 80.0 - 100.0 fL   MCH 30.4 26.0 - 34.0 pg   MCHC 33.9 30.0 - 36.0 g/dL   RDW 41.6 60.6 - 30.1 %   Platelets 182 150 - 400 K/uL   nRBC 0.0 0.0 - 0.2 %   Neutrophils Relative % 50 %   Neutro Abs 2.4 1.7 - 7.7 K/uL   Lymphocytes Relative 34 %   Lymphs Abs 1.6 0.7 - 4.0 K/uL   Monocytes Relative 11 %   Monocytes Absolute 0.6 0.1 - 1.0 K/uL   Eosinophils Relative 4 %   Eosinophils Absolute 0.2 0.0 - 0.5 K/uL   Basophils Relative 1 %   Basophils Absolute 0.1 0.0 - 0.1 K/uL   Immature Granulocytes 0 %   Abs Immature Granulocytes 0.01 0.00 - 0.07 K/uL    Comment: Performed at [facility], [address] [place], [place] [address]  Lipid panel     Status: None   Collection Time: 08/29/22  9:21 AM  Result Value Ref Range   Cholesterol, Total 108 100 - 199 mg/dL   Triglycerides 41 0 - 149 mg/dL   HDL 50 >32 mg/dL   VLDL Cholesterol Cal 11 5 - 40 mg/dL   LDL Chol Calc (NIH) 47 0 - 99 mg/dL   Chol/HDL Ratio 2.2 0.0 - 5.0 ratio    Comment:                                   T. Chol/HDL Ratio                                             Men  Women                               1/2 Avg.Risk  3.4    3.3                                   Avg.Risk  5.0    4.4                                2X Avg.Risk  9.6    7.1  3X Avg.Risk 23.4   11.0   Hemoglobin A1c     Status: Abnormal   Collection Time: 08/29/22  9:21 AM  Result Value Ref Range   Hgb A1c MFr Bld 6.8  (H) 4.8 - 5.6 %    Comment:          Prediabetes: 5.7 - 6.4          Diabetes: >6.4          Glycemic control for adults with diabetes: <7.0    Est. average glucose Bld gHb Est-mCnc 148 mg/dL  Comprehensive metabolic panel     Status: Abnormal   Collection Time: 08/29/22  9:21 AM  Result Value Ref Range   Glucose 120 (H) 70 - 99 mg/dL   BUN 12 8 - 27 mg/dL   Creatinine, Ser 1.61 (L) 0.76 - 1.27 mg/dL   eGFR 096 >04 VW/UJW/1.19   BUN/Creatinine Ratio 17 10 - 24   Sodium 142 134 - 144 mmol/L   Potassium 4.1 3.5 - 5.2 mmol/L   Chloride 105 96 - 106 mmol/L   CO2 25 20 - 29 mmol/L   Calcium 9.7 8.6 - 10.2 mg/dL   Total Protein 7.0 6.0 - 8.5 g/dL   Albumin 4.3 3.9 - 4.9 g/dL   Globulin, Total 2.7 1.5 - 4.5 g/dL   Bilirubin Total 0.7 0.0 - 1.2 mg/dL   Alkaline Phosphatase 71 44 - 121 IU/L   AST 11 0 - 40 IU/L   ALT 10 0 - 44 IU/L  POCT CBG (Fasting - Glucose)     Status: Normal   Collection Time: 09/06/22 10:47 AM  Result Value Ref Range   Glucose Fasting, POC 86 70 - 99 mg/dL      Assessment & Plan:   Problem List Items Addressed This Visit       Cardiovascular and Mediastinum   Primary hypertension     Endocrine   Type 2 diabetes mellitus without complication, without long-term current use of insulin (HCC) - Primary   Relevant Orders   POCT CBG (Fasting - Glucose) (Completed)     Genitourinary   Prostate cancer (HCC) (Chronic)     Other   Mixed hyperlipidemia    Return in about 3 months (around 12/07/2022).   Total time spent: 20 minutes  Luna Fuse, MD  09/06/2022   This document may have been prepared by [facility] Voice Recognition software and as such may include unintentional dictation errors.

## 2022-10-10 ENCOUNTER — Other Ambulatory Visit: Payer: Self-pay | Admitting: Internal Medicine

## 2022-10-10 DIAGNOSIS — E119 Type 2 diabetes mellitus without complications: Secondary | ICD-10-CM

## 2022-11-21 ENCOUNTER — Other Ambulatory Visit: Payer: Self-pay | Admitting: Internal Medicine

## 2022-11-21 DIAGNOSIS — E119 Type 2 diabetes mellitus without complications: Secondary | ICD-10-CM

## 2022-11-22 ENCOUNTER — Other Ambulatory Visit: Payer: Self-pay | Admitting: Urology

## 2022-11-28 ENCOUNTER — Other Ambulatory Visit: Payer: Medicare Other

## 2022-11-28 DIAGNOSIS — E782 Mixed hyperlipidemia: Secondary | ICD-10-CM

## 2022-11-28 DIAGNOSIS — E119 Type 2 diabetes mellitus without complications: Secondary | ICD-10-CM

## 2022-11-29 LAB — COMPREHENSIVE METABOLIC PANEL
ALT: 13 IU/L (ref 0–44)
AST: 13 IU/L (ref 0–40)
Albumin: 4.4 g/dL (ref 3.9–4.9)
Alkaline Phosphatase: 74 IU/L (ref 44–121)
BUN/Creatinine Ratio: 17 (ref 10–24)
BUN: 13 mg/dL (ref 8–27)
Bilirubin Total: 1.1 mg/dL (ref 0.0–1.2)
CO2: 22 mmol/L (ref 20–29)
Calcium: 9.8 mg/dL (ref 8.6–10.2)
Chloride: 105 mmol/L (ref 96–106)
Creatinine, Ser: 0.75 mg/dL — ABNORMAL LOW (ref 0.76–1.27)
Globulin, Total: 2.6 g/dL (ref 1.5–4.5)
Glucose: 103 mg/dL — ABNORMAL HIGH (ref 70–99)
Potassium: 3.9 mmol/L (ref 3.5–5.2)
Sodium: 144 mmol/L (ref 134–144)
Total Protein: 7 g/dL (ref 6.0–8.5)
eGFR: 97 mL/min/{1.73_m2} (ref >59)

## 2022-11-29 LAB — LIPID PANEL
Chol/HDL Ratio: 2.4 ratio (ref 0.0–5.0)
Cholesterol, Total: 117 mg/dL (ref 100–199)
HDL: 49 mg/dL (ref >39)
LDL Chol Calc (NIH): 57 mg/dL (ref 0–99)
Triglycerides: 48 mg/dL (ref 0–149)
VLDL Cholesterol Cal: 11 mg/dL (ref 5–40)

## 2022-11-29 LAB — HEMOGLOBIN A1C
Est. average glucose Bld gHb Est-mCnc: 131 mg/dL
Hgb A1c MFr Bld: 6.2 % — ABNORMAL HIGH (ref 4.8–5.6)

## 2022-12-05 ENCOUNTER — Ambulatory Visit (INDEPENDENT_AMBULATORY_CARE_PROVIDER_SITE_OTHER): Payer: Medicare Other | Admitting: Internal Medicine

## 2022-12-05 ENCOUNTER — Encounter: Payer: Self-pay | Admitting: Internal Medicine

## 2022-12-05 VITALS — BP 120/75 | HR 88 | Ht 70.0 in | Wt 216.4 lb

## 2022-12-05 DIAGNOSIS — C61 Malignant neoplasm of prostate: Secondary | ICD-10-CM | POA: Diagnosis not present

## 2022-12-05 DIAGNOSIS — E782 Mixed hyperlipidemia: Secondary | ICD-10-CM | POA: Diagnosis not present

## 2022-12-05 DIAGNOSIS — E119 Type 2 diabetes mellitus without complications: Secondary | ICD-10-CM

## 2022-12-05 DIAGNOSIS — Z23 Encounter for immunization: Secondary | ICD-10-CM | POA: Diagnosis not present

## 2022-12-05 DIAGNOSIS — I10 Essential (primary) hypertension: Secondary | ICD-10-CM

## 2022-12-05 LAB — POCT CBG (FASTING - GLUCOSE)-MANUAL ENTRY: Glucose Fasting, POC: 117 mg/dL — AB (ref 70–99)

## 2022-12-05 NOTE — Progress Notes (Signed)
Established Patient Office Visit  Subjective:  Patient ID: Jose Randolph, male    DOB: [birth year]  Age: 72 y.o. MRN: [id number]  Chief Complaint  Patient presents with   Follow-up    3 mo with lab results    No new complaints, here for lab review and medication refills. No new complaints, here for lab review and medication refills. Labs reviewed and notable for well controlled diabetes, A1c at target, lipids at target with unremarkable cmp. Denies any hypoglycemic episodes and home bg readings have been at target.   No other concerns at this time.   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 Excised, margin involved.    Past Surgical History:  Procedure Laterality Date   COLONOSCOPY WITH PROPOFOL N/A 10/28/2019   Procedure: COLONOSCOPY WITH PROPOFOL;  Surgeon: Regis Bill, MD;  Location: [facility] ENDOSCOPY;  Service: Endoscopy;  Laterality: N/A;   FRACTURE SURGERY      Social History   Socioeconomic History   Marital status: Married    Spouse name: Not on file   Number of children: Not on file   Years of education: Not on file   Highest education level: Not on file  Occupational History   Not on file  Tobacco Use   Smoking status: Former    Current packs/day: 0.00    Average packs/day: 0.5 packs/day for 5.0 years (2.5 ttl pk-yrs)    Types: Cigarettes    Start date: 03/06/1978    Quit date: 03/07/1983    Years since quitting: 39.7   Smokeless tobacco: Never  Vaping Use   Vaping status: Never Used  Substance and Sexual Activity   Alcohol use: Never   Drug use: Never   Sexual activity: Yes    Birth control/protection: None  Other Topics Concern   Not on file  Social History Narrative   Not on file   Social Determinants of Health   Financial Resource Strain: Not on file  Food Insecurity: Not on  file  Transportation Needs: Not on file  Physical Activity: Not on file  Stress: Not on file  Social Connections: Not on file  Intimate Partner Violence: Not on file    Family History  Problem Relation Age of Onset   Colon cancer Father        dx 68s   Prostate cancer Brother 63   Prostate cancer Brother 75   Kidney cancer Brother 59   Cancer Maternal Aunt        unk type   Lung cancer Maternal Uncle    Leukemia Grandson 2    Allergies  Allergen Reactions   Oxycodone    Bee Pollen Itching    Review of Systems  Constitutional: Negative.   HENT: Negative.    Eyes: Negative.   Respiratory: Negative.    Cardiovascular: Negative.   Gastrointestinal: Negative.   Genitourinary: Negative.   Skin: Negative.   Neurological: Negative.   Endo/Heme/Allergies: Negative.        Objective:   BP 120/75   Pulse 88   Ht 5\' 10"  (1.778 m)   Wt 216 lb 6.4 oz (98.2 kg)   SpO2 96%   BMI 31.05 kg/m   Vitals:   12/05/22 1051  BP: 120/75  Pulse: 88  Height: 5\' 10"  (1.778 m)  Weight: 216 lb  6.4 oz (98.2 kg)  SpO2: 96%  BMI (Calculated): 31.05    Physical Exam Vitals reviewed.  Constitutional:      Appearance: Normal appearance.  HENT:     Head: Normocephalic.     Left Ear: There is no impacted cerumen.     Nose: Nose normal.     Mouth/Throat:     Mouth: Mucous membranes are moist.     Pharynx: No posterior oropharyngeal erythema.  Eyes:     Extraocular Movements: Extraocular movements intact.     Pupils: Pupils are equal, round, and reactive to light.  Cardiovascular:     Rate and Rhythm: Regular rhythm.     Chest Wall: PMI is not displaced.     Pulses: Normal pulses.     Heart sounds: Normal heart sounds. No murmur heard. Pulmonary:     Effort: Pulmonary effort is normal.     Breath sounds: Normal air entry. No rhonchi or rales.  Abdominal:     General: Abdomen is flat. Bowel sounds are normal. There is no distension.     Palpations: Abdomen is soft. There  is no hepatomegaly, splenomegaly or mass.     Tenderness: There is no abdominal tenderness.  Musculoskeletal:        General: Normal range of motion.     Cervical back: Normal range of motion and neck supple.     Right lower leg: No edema.     Left lower leg: No edema.  Skin:    General: Skin is warm and dry.  Neurological:     General: No focal deficit present.     Mental Status: He is alert and oriented to person, place, and time.     Cranial Nerves: No cranial nerve deficit.     Motor: No weakness.  Psychiatric:        Mood and Affect: Mood normal.        Behavior: Behavior normal.      No results found for any visits on 12/05/22.  Recent Results (from the past 2160 hour(Porshe Fleagle))  POC CREATINE & ALBUMIN,URINE     Status: Normal   Collection Time: 09/06/22 11:26 AM  Result Value Ref Range   Microalbumin Ur, POC 10 mg/L   Creatinine, POC 50 mg/dL   Albumin/Creatinine Ratio, Urine, POC <30   Lipid panel     Status: None   Collection Time: 11/28/22 10:59 AM  Result Value Ref Range   Cholesterol, Total 117 100 - 199 mg/dL   Triglycerides 48 0 - 149 mg/dL   HDL 49 >52 mg/dL   VLDL Cholesterol Cal 11 5 - 40 mg/dL   LDL Chol Calc (NIH) 57 0 - 99 mg/dL   Chol/HDL Ratio 2.4 0.0 - 5.0 ratio    Comment:                                   T. Chol/HDL Ratio                                             Men  Women                               1/2 Avg.Risk  3.4    3.3  Avg.Risk  5.0    4.4                                2X Avg.Risk  9.6    7.1                                3X Avg.Risk 23.4   11.0   Comprehensive metabolic panel     Status: Abnormal   Collection Time: 11/28/22 10:59 AM  Result Value Ref Range   Glucose 103 (H) 70 - 99 mg/dL   BUN 13 8 - 27 mg/dL   Creatinine, Ser 1.61 (L) 0.76 - 1.27 mg/dL   eGFR 97 >09 UE/AVW/0.98   BUN/Creatinine Ratio 17 10 - 24   Sodium 144 134 - 144 mmol/L   Potassium 3.9 3.5 - 5.2 mmol/L   Chloride 105 96 -  106 mmol/L   CO2 22 20 - 29 mmol/L   Calcium 9.8 8.6 - 10.2 mg/dL   Total Protein 7.0 6.0 - 8.5 g/dL   Albumin 4.4 3.9 - 4.9 g/dL   Globulin, Total 2.6 1.5 - 4.5 g/dL   Bilirubin Total 1.1 0.0 - 1.2 mg/dL   Alkaline Phosphatase 74 44 - 121 IU/L   AST 13 0 - 40 IU/L   ALT 13 0 - 44 IU/L  Hemoglobin A1c     Status: Abnormal   Collection Time: 11/28/22 10:59 AM  Result Value Ref Range   Hgb A1c MFr Bld 6.2 (H) 4.8 - 5.6 %    Comment:          Prediabetes: 5.7 - 6.4          Diabetes: >6.4          Glycemic control for adults with diabetes: <7.0    Est. average glucose Bld gHb Est-mCnc 131 mg/dL      Assessment & Plan:  As per problem list  Problem List Items Addressed This Visit       Cardiovascular and Mediastinum   Primary hypertension     Endocrine   Type 2 diabetes mellitus without complication, without long-term current use of insulin (HCC) - Primary   Relevant Orders   POCT CBG (Fasting - Glucose)     Other   Mixed hyperlipidemia    Return in about 3 months (around 03/07/2023) for fu with labs prior.   Total time spent: 20 minutes  Luna Fuse, MD  12/05/2022   This document may have been prepared by [facility] Voice Recognition software and as such may include unintentional dictation errors.

## 2022-12-06 ENCOUNTER — Ambulatory Visit: Payer: Medicare Other | Admitting: Internal Medicine

## 2022-12-11 ENCOUNTER — Encounter: Payer: Self-pay | Admitting: Urology

## 2022-12-11 ENCOUNTER — Ambulatory Visit (INDEPENDENT_AMBULATORY_CARE_PROVIDER_SITE_OTHER): Payer: Medicare Other | Admitting: Urology

## 2022-12-11 VITALS — BP 107/67 | HR 80 | Ht 70.0 in | Wt 216.0 lb

## 2022-12-11 DIAGNOSIS — C61 Malignant neoplasm of prostate: Secondary | ICD-10-CM | POA: Diagnosis not present

## 2022-12-11 DIAGNOSIS — N529 Male erectile dysfunction, unspecified: Secondary | ICD-10-CM | POA: Diagnosis not present

## 2022-12-11 NOTE — Progress Notes (Signed)
I, Jose Randolph, acting as a scribe for Jose Altes, MD., have documented all relevant documentation on the behalf of Jose Altes, MD, as directed by Jose Altes, MD while in the presence of Jose Altes, MD.  12/11/2022 1:20 PM   Jose Randolph 1951/03/01 [id number]  Referring provider: [name], MD [address] [place],  [place] [address]  Chief Complaint  Patient presents with   Follow-up   Urologic history: 1.  T1c adeno CA prostate (intermediate risk unfavorable) Diagnosed low risk prostate cancer 2017 and was on active surveillance in [place] (no records available) PSA January 2022 was 10.0 MRI with PI-RADS 4 lesion left anterior PZ and PI-RADS 3 lesion MRI fusion biopsy 05/12/2020; volume 72 cc: 7/7 ROI biopsies benign; 1 core Gleason 4+3/1 core Gleason 3+3 Treated IMRT + ADT x6 months Radiation completed 08/11/2020   2.  Erectile dysfunction   HPI: [name] is a 72 y.o. male presents for annual follow-up.   Patient saw Dr. Rushie Randolph May 2024 and PSA was 0.94 He had been seen by a medical oncology, who elected not to start androgen deprivation therapy.  He has an appointment with Dr. Rushie Randolph and Dr. Cathie Randolph next month with a PSA.  No bothersome LUTS.  IPSS today 4/35. He did want to discuss ED options today. He does not have significant erections and unlikely PDE5 inhibitor would be beneficial.    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 Excised, margin involved.    Surgical History: Past Surgical History:  Procedure Laterality Date   COLONOSCOPY WITH PROPOFOL N/A 10/28/2019   Procedure: COLONOSCOPY WITH PROPOFOL;  Surgeon: Jose Bill, MD;  Location: [facility] ENDOSCOPY;  Service: Endoscopy;  Laterality: N/A;   FRACTURE SURGERY      Home Medications:   Allergies as of 12/11/2022       Reactions   Oxycodone    Bee Pollen Itching        Medication List        Accurate as of December 11, 2022  1:20 PM. If you have any questions, ask your nurse or doctor.          atorvastatin 40 MG tablet Commonly known as: LIPITOR TAKE 1 TABLET BY MOUTH EVERY DAY   carvedilol 20 MG 24 hr capsule Commonly known as: COREG CR TAKE 1 CAPSULE BY MOUTH EVERY DAY IN THE MORNING   Cholecalciferol 25 MCG (1000 UT) tablet Take by mouth.   Farxiga 10 MG Tabs tablet Generic drug: dapagliflozin propanediol TAKE 1 TABLET BY MOUTH EVERY DAY BEFORE BREAKFAST   halobetasol 0.05 % ointment Commonly known as: ULTRAVATE Apply topically.   metFORMIN 500 MG 24 hr tablet Commonly known as: GLUCOPHAGE-XR TAKE 4 TABLETS BY MOUTH DAILY WITH BREAKFAST   Olmesartan-amLODIPine-HCTZ 40-10-12.5 MG Tabs TAKE 1 TABLET BY MOUTH EVERY DAY IN THE MORNING   OneTouch Ultra test strip Generic drug: glucose blood 2 (two) times daily.   onetouch ultrasoft lancets USE WITH DEVICE TO CHECK SUGARS TWICE DAILY DX E11.9   tadalafil 20 MG tablet Commonly known as: CIALIS EVERY 36 HOURS AS NEEDED FOR ED.   tamsulosin 0.4 MG Caps capsule Commonly known as: FLOMAX TAKE 1 CAPSULE BY MOUTH 2 TIMES DAILY.   Valchlor 0.016 % Gel Generic drug: Mechlorethamine HCl  Apply topically.        Allergies:  Allergies  Allergen Reactions   Oxycodone    Bee Pollen Itching    Family History: Family History  Problem Relation Age of Onset   Colon cancer Father        dx 11s   Prostate cancer Brother 50   Prostate cancer Brother 21   Kidney cancer Brother 18   Cancer Maternal Aunt        unk type   Lung cancer Maternal Uncle    Leukemia Grandson 2    Social History:  reports that he quit smoking about 39 years ago. His smoking use included cigarettes. He started smoking about 44 years ago. He has a 2.5 pack-year smoking history. He has never used smokeless tobacco.  He reports that he does not drink alcohol and does not use drugs.   Physical Exam: BP 107/67   Pulse 80   Ht 5\' 10"  (1.778 m)   Wt 216 lb (98 kg)   BMI 30.99 kg/m   Constitutional:  Alert and oriented, No acute distress. HEENT: [place] AT Respiratory: Normal respiratory effort, no increased work of breathing. Psychiatric: Normal mood and affect.   Assessment & Plan:    1. T1c intermediate-risk prostate cancer He is following both radiation and medical oncology and will defer PSA monitoring to them.   2. Erectile dysfunction Intracavernosal injection and vacuum erection devices were discussed and he was provided literature If he desires to pursue, he will call back.   I have reviewed the above documentation for accuracy and completeness, and I agree with the above.   Jose Altes, MD  [facility] [address] [place], [place] [address] [phone]

## 2022-12-12 ENCOUNTER — Ambulatory Visit: Payer: Medicare Other | Admitting: Internal Medicine

## 2022-12-13 ENCOUNTER — Ambulatory Visit: Payer: Medicare Other | Admitting: Urology

## 2023-01-08 ENCOUNTER — Inpatient Hospital Stay: Payer: Medicare Other | Attending: Oncology

## 2023-01-08 DIAGNOSIS — Z8572 Personal history of non-Hodgkin lymphomas: Secondary | ICD-10-CM | POA: Insufficient documentation

## 2023-01-08 DIAGNOSIS — C61 Malignant neoplasm of prostate: Secondary | ICD-10-CM | POA: Insufficient documentation

## 2023-01-08 DIAGNOSIS — Z923 Personal history of irradiation: Secondary | ICD-10-CM | POA: Diagnosis not present

## 2023-01-08 DIAGNOSIS — Z8042 Family history of malignant neoplasm of prostate: Secondary | ICD-10-CM | POA: Diagnosis not present

## 2023-01-08 DIAGNOSIS — Z806 Family history of leukemia: Secondary | ICD-10-CM | POA: Diagnosis not present

## 2023-01-08 DIAGNOSIS — Z8052 Family history of malignant neoplasm of bladder: Secondary | ICD-10-CM | POA: Insufficient documentation

## 2023-01-08 DIAGNOSIS — Z87891 Personal history of nicotine dependence: Secondary | ICD-10-CM | POA: Diagnosis not present

## 2023-01-08 DIAGNOSIS — Z801 Family history of malignant neoplasm of trachea, bronchus and lung: Secondary | ICD-10-CM | POA: Insufficient documentation

## 2023-01-08 LAB — CBC WITH DIFFERENTIAL (CANCER CENTER ONLY)
Abs Immature Granulocytes: 0.01 10*3/uL (ref 0.00–0.07)
Basophils Absolute: 0 10*3/uL (ref 0.0–0.1)
Basophils Relative: 1 %
Eosinophils Absolute: 0.1 10*3/uL (ref 0.0–0.5)
Eosinophils Relative: 3 %
HCT: 38.8 % — ABNORMAL LOW (ref 39.0–52.0)
Hemoglobin: 13.3 g/dL (ref 13.0–17.0)
Immature Granulocytes: 0 %
Lymphocytes Relative: 31 %
Lymphs Abs: 1.5 10*3/uL (ref 0.7–4.0)
MCH: 31.1 pg (ref 26.0–34.0)
MCHC: 34.3 g/dL (ref 30.0–36.0)
MCV: 90.9 fL (ref 80.0–100.0)
Monocytes Absolute: 0.5 10*3/uL (ref 0.1–1.0)
Monocytes Relative: 11 %
Neutro Abs: 2.6 10*3/uL (ref 1.7–7.7)
Neutrophils Relative %: 54 %
Platelet Count: 177 10*3/uL (ref 150–400)
RBC: 4.27 MIL/uL (ref 4.22–5.81)
RDW: 13.6 % (ref 11.5–15.5)
WBC Count: 4.8 10*3/uL (ref 4.0–10.5)
nRBC: 0 % (ref 0.0–0.2)

## 2023-01-08 LAB — CMP (CANCER CENTER ONLY)
ALT: 15 U/L (ref 0–44)
AST: 16 U/L (ref 15–41)
Albumin: 4 g/dL (ref 3.5–5.0)
Alkaline Phosphatase: 64 U/L (ref 38–126)
Anion gap: 8 (ref 5–15)
BUN: 14 mg/dL (ref 8–23)
CO2: 25 mmol/L (ref 22–32)
Calcium: 9.4 mg/dL (ref 8.9–10.3)
Chloride: 105 mmol/L (ref 98–111)
Creatinine: 0.65 mg/dL (ref 0.61–1.24)
GFR, Estimated: >60 mL/min (ref >60)
Glucose, Bld: 116 mg/dL — ABNORMAL HIGH (ref 70–99)
Potassium: 3.5 mmol/L (ref 3.5–5.1)
Sodium: 138 mmol/L (ref 135–145)
Total Bilirubin: 1.3 mg/dL — ABNORMAL HIGH (ref <1.2)
Total Protein: 7.2 g/dL (ref 6.5–8.1)

## 2023-01-08 LAB — PSA: Prostatic Specific Antigen: 1.11 ng/mL (ref 0.00–4.00)

## 2023-01-11 ENCOUNTER — Inpatient Hospital Stay: Payer: Medicare Other | Admitting: Oncology

## 2023-01-11 ENCOUNTER — Ambulatory Visit
Admission: RE | Admit: 2023-01-11 | Discharge: 2023-01-11 | Disposition: A | Discharge status: Home / Self Care | Payer: Medicare Other | Source: Ambulatory Visit | Attending: Radiation Oncology | Admitting: Radiation Oncology

## 2023-01-11 ENCOUNTER — Encounter: Payer: Self-pay | Admitting: Oncology

## 2023-01-11 VITALS — BP 127/81 | HR 83 | Temp 96.0°F | Resp 18 | Wt 214.2 lb

## 2023-01-11 DIAGNOSIS — R197 Diarrhea, unspecified: Secondary | ICD-10-CM | POA: Diagnosis not present

## 2023-01-11 DIAGNOSIS — Z923 Personal history of irradiation: Secondary | ICD-10-CM | POA: Insufficient documentation

## 2023-01-11 DIAGNOSIS — C61 Malignant neoplasm of prostate: Secondary | ICD-10-CM | POA: Diagnosis not present

## 2023-01-11 NOTE — Progress Notes (Signed)
Hematology/Oncology Progress note [phone] [phone]        REFERRING PROVIDER: [name], MD   CHIEF COMPLAINTS/PURPOSE OF CONSULTATION:  Prostate cancer  ASSESSMENT & PLAN:   Cancer Staging  Prostate cancer [facility]) Staging form: Prostate, AJCC 8th Edition - Clinical stage from 04/21/2015: Stage IIB (cT1, cN0, cM0, PSA: 10, Grade Group: 2) - Signed by Jose Patience, MD on 12/20/2021   Prostate cancer [facility]) Prostate cancer status post definitive radiation.   PSA trending up due to biochemical recurrence, increase of 2 ng/ ml above nadir.  Interval between radiation and recurrence less than 18 months.  High risk. PSMA PET scan showed nonspecific prostate activity.  No definitive radiographic evidence of recurrence. PSA mildly increased.  Continue observation.   Orders Placed This Encounter  Procedures   CMP (Cancer Center only)    Standing Status:   Future    Standing Expiration Date:   01/11/2024   CBC with Differential (Cancer Center Only)    Standing Status:   Future    Standing Expiration Date:   01/11/2024   PSA    Standing Status:   Future    Standing Expiration Date:   01/11/2024   Follow-up in 6 months. All questions were answered. The patient knows to call the clinic with any problems, questions or concerns.  Jose Patience, MD, PhD [facility] Hematology Oncology 01/11/2023       HISTORY OF PRESENTING ILLNESS:  Jose Randolph 72 y.o. male presents to establish care for  I have reviewed his chart and materials related to his cancer extensively and collaborated history with the patient. Summary of oncologic history is as follows: Oncology History  Prostate cancer (HCC)  04/21/2015 Cancer Staging   Staging form: Prostate, AJCC 8th Edition - Clinical stage from 04/21/2015: Stage IIB (cT1, cN0, cM0, PSA: 10, Grade Group: 2) - Signed by Jose Patience, MD on 12/20/2021 Stage prefix: Initial diagnosis Prostate specific antigen (PSA) range: 10 to  19 Gleason score: 7 Histologic grading system: 5 grade system   04/21/2015 Initial Diagnosis   Prostate cancer   -Per urologist who has obtained previous records, prostate biopsy 04/21/2015 which did show a focus of Gleason 3+3 adenocarcinoma in the left mid core involving 5% of the submitted tissue -Initiated patient elected active surveillance. -MRI prostate in 2017 showed no suspicious lesion in the prostate volume of 79 cc. -April 2018 biopsy showed benign tissue and a focus of atypical glands suspicious for adenocarcinoma right base.  PSA was elevated at 4.99     05/13/2018 Procedure   Per Urology note MR fusion biopsy [facility] 05/12/2020; prostate volume 72 g ROI biopsies x7 taken all showing benign prostate tissue LML core + Gleason 4+3 adenocarcinoma involving 15% of the submitted tissue LAL core + Gleason 3+3 adenocarcinoma involving 5% of submitted tissue Remaining biopsies with benign prostate tissue   03/31/2020 Tumor Marker   PSA 10.2.   04/14/2020 Imaging   MRI prostate with and without contrast -1. PI-RADS category 4 lesion of the left anterior peripheral zone in the mid gland and apex. 2. PI-RADS category 3 lesion of the right anterior and posterolateral peripheral zone at the apex. 3. Targeting data sent to [facility]. 4. Benign prostatic hypertrophy, prostate volume 102.85 cubic cm   06/16/2020 - 08/11/2020 Radiation Therapy   Prostate radiation   12/06/2020 Tumor Marker   PSA 0.11   06/06/2021 Tumor Marker   PSA 0.74   12/12/2021 Tumor Marker   PSA  2.33.  12/27/2021 Imaging   PSMA PET scan pending   01/31/2022 Genetic Testing   Negative genetic testing. No pathogenic variants identified on the [facility] Common Hereditary Cancers+RNA panel. The report date is 01/27/2022.  The Common Hereditary Cancers Panel + RNA offered by [facility] includes sequencing and/or deletion duplication testing of the following 48 genes: APC*, ATM*, AXIN2, BAP1, BARD1, BMPR1A, BRCA1, BRCA2,  BRIP1, CDH1, CDK4, CDKN2A (p14ARF), CDKN2A (p16INK4a), CHEK2, CTNNA1, DICER1*, EPCAM*, FH*, GREM1*, HOXB13, KIT, MBD4, MEN1*, MLH1*, MSH2*, MSH3*, MSH6*, MUTYH, NF1*, NTHL1, PALB2, PDGFRA, PMS2*, POLD1*, POLE, PTEN*, RAD51C, RAD51D, SDHA*, SDHB, SDHC*, SDHD, SMAD4, SMARCA4, STK11, TP53, TSC1*, TSC2, VHL.    04/03/2022 Tumor Marker   PSA 1.55    Patient was referred to establish care with medical oncology due to increase of PSA level. Patient was accompanied by wife.  Patient denies  urinary urgency, dysuria.  Occasionally patient has loose bowel movements, manageable with Imodium as needed.  Patient attributes to metformin use.  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 Constance Randolph and is currently on phototherapy. Patient reported family history of prostate cancer and colon cancer.    [name] who has above history reviewed by me today presents for follow up visit for prostate cancer.  He denies any new complaints.   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 Excised, margin involved.    SURGICAL HISTORY: Past Surgical History:  Procedure Laterality Date   COLONOSCOPY WITH PROPOFOL N/A 10/28/2019   Procedure: COLONOSCOPY WITH PROPOFOL;  Surgeon: Jose Bill, MD;  Location: [facility] ENDOSCOPY;  Service: Endoscopy;  Laterality: N/A;   FRACTURE SURGERY      SOCIAL HISTORY: Social History   Socioeconomic History   Marital status: Married    Spouse name: Not on file   Number of children: Not on file   Years of education: Not on file   Highest education level: Not on file  Occupational History   Not on file  Tobacco Use   Smoking status: Former    Current packs/day: 0.00    Average packs/day: 0.5 packs/day  for 5.0 years (2.5 ttl pk-yrs)    Types: Cigarettes    Start date: 03/06/1978    Quit date: 03/07/1983    Years since quitting: 39.8   Smokeless tobacco: Never  Vaping Use   Vaping status: Never Used  Substance and Sexual Activity   Alcohol use: Never   Drug use: Never   Sexual activity: Yes    Birth control/protection: None  Other Topics Concern   Not on file  Social History Narrative   Not on file   Social Determinants of Health   Financial Resource Strain: Not on file  Food Insecurity: Not on file  Transportation Needs: Not on file  Physical Activity: Not on file  Stress: Not on file  Social Connections: Not on file  Intimate Partner Violence: Not on file    FAMILY HISTORY: Family History  Problem Relation Age of Onset   Colon cancer Father        dx 75s   Prostate cancer Brother 29   Prostate cancer Brother 35   Kidney cancer Brother 25   Cancer Maternal Aunt        unk type  Lung cancer Maternal Uncle    Leukemia Grandson 2    ALLERGIES:  is allergic to oxycodone and bee pollen.  MEDICATIONS:  Current Outpatient Medications  Medication Sig Dispense Refill   atorvastatin (LIPITOR) 40 MG tablet TAKE 1 TABLET BY MOUTH EVERY DAY 90 tablet 1   carvedilol (COREG CR) 20 MG 24 hr capsule TAKE 1 CAPSULE BY MOUTH EVERY DAY IN THE MORNING 90 capsule 1   Cholecalciferol 25 MCG (1000 UT) tablet Take by mouth.     FARXIGA 10 MG TABS tablet TAKE 1 TABLET BY MOUTH EVERY DAY BEFORE BREAKFAST 90 tablet 0   halobetasol (ULTRAVATE) 0.05 % ointment Apply topically.     Lancets (ONETOUCH ULTRASOFT) lancets USE WITH DEVICE TO CHECK SUGARS TWICE DAILY DX E11.9     Mechlorethamine HCl (VALCHLOR) 0.016 % GEL Apply topically.     metFORMIN (GLUCOPHAGE-XR) 500 MG 24 hr tablet TAKE 4 TABLETS BY MOUTH DAILY WITH BREAKFAST 360 tablet 1   Olmesartan-amLODIPine-HCTZ 40-10-12.5 MG TABS TAKE 1 TABLET BY MOUTH EVERY DAY IN THE MORNING 90 tablet 0   ONETOUCH ULTRA test strip 2 (two) times  daily.     tadalafil (CIALIS) 20 MG tablet EVERY 36 HOURS AS NEEDED FOR ED.     tamsulosin (FLOMAX) 0.4 MG CAPS capsule TAKE 1 CAPSULE BY MOUTH 2 TIMES DAILY. 180 capsule 3   No current facility-administered medications for this visit.    Review of Systems  Constitutional:  Negative for appetite change, chills, fatigue, fever and unexpected weight change.  HENT:   Negative for hearing loss and voice change.   Eyes:  Negative for eye problems and icterus.  Respiratory:  Negative for chest tightness, cough and shortness of breath.   Cardiovascular:  Negative for chest pain and leg swelling.  Gastrointestinal:  Negative for abdominal distention, abdominal pain and diarrhea.  Endocrine: Negative for hot flashes.  Genitourinary:  Negative for difficulty urinating, dysuria and frequency.   Musculoskeletal:  Negative for arthralgias.  Skin:  Negative for itching and rash.  Neurological:  Negative for light-headedness and numbness.  Hematological:  Negative for adenopathy. Does not bruise/bleed easily.  Psychiatric/Behavioral:  Negative for confusion.      PHYSICAL EXAMINATION: ECOG PERFORMANCE STATUS: 1 - Symptomatic but completely ambulatory  Vitals:   01/11/23 0953  BP: 127/81  Pulse: 83  Resp: 18  Temp: (!) 96 F (35.6 C)  SpO2: 96%   Filed Weights   01/11/23 0953  Weight: 214 lb 3.2 oz (97.2 kg)    Physical Exam Constitutional:      Appearance: Normal appearance. He is not diaphoretic.  HENT:     Head: Normocephalic and atraumatic.  Eyes:     General: No scleral icterus. Cardiovascular:     Rate and Rhythm: Normal rate and regular rhythm.  Pulmonary:     Effort: Pulmonary effort is normal. No respiratory distress.  Abdominal:     General: Bowel sounds are normal.     Palpations: Abdomen is soft.  Musculoskeletal:        General: Normal range of motion.     Cervical back: Normal range of motion and neck supple.  Skin:    General: Skin is warm and dry.      Findings: No erythema.  Neurological:     Mental Status: He is alert and oriented to person, place, and time. Mental status is at baseline.     Cranial Nerves: No cranial nerve deficit.     Motor: No abnormal muscle  tone.  Psychiatric:        Mood and Affect: Affect normal.      LABORATORY DATA:  I have reviewed the data as listed    Latest Ref Rng & Units 01/08/2023    8:13 AM 07/04/2022    7:57 AM 05/16/2022    8:41 AM  CBC  WBC 4.0 - 10.5 K/uL 4.8  4.8  4.8   Hemoglobin 13.0 - 17.0 g/dL 40.9  81.1  91.4   Hematocrit 39.0 - 52.0 % 38.8  40.7  42.1   Platelets 150 - 400 K/uL 177  182  196       Latest Ref Rng & Units 01/08/2023    8:13 AM 11/28/2022   10:59 AM 08/29/2022    9:21 AM  CMP  Glucose 70 - 99 mg/dL 782  956  213   BUN 8 - 23 mg/dL 14  13  12    Creatinine 0.61 - 1.24 mg/dL 0.86  5.78  4.69   Sodium 135 - 145 mmol/L 138  144  142   Potassium 3.5 - 5.1 mmol/L 3.5  3.9  4.1   Chloride 98 - 111 mmol/L 105  105  105   CO2 22 - 32 mmol/L 25  22  25    Calcium 8.9 - 10.3 mg/dL 9.4  9.8  9.7   Total Protein 6.5 - 8.1 g/dL 7.2  7.0  7.0   Total Bilirubin <1.2 mg/dL 1.3  1.1  0.7   Alkaline Phos 38 - 126 U/L 64  74  71   AST 15 - 41 U/L 16  13  11    ALT 0 - 44 U/L 15  13  10       RADIOGRAPHIC STUDIES: I have personally reviewed the radiological images as listed and agreed with the findings in the report. No results found.

## 2023-01-11 NOTE — Progress Notes (Signed)
Radiation Oncology Follow up Note  Name: Jose Randolph   Date:   01/11/2023 MRN:  [id number] DOB: 1950/04/07    This 72 y.o. male presents to the clinic today for 28 92-month follow-up status post image guided IMRT radiation therapy for Gleason 7 (4+3) adenocarcinoma presenting with a PSA in the 10 range.Marland Kitchen  REFERRING PROVIDER: [name], MD  HPI: Patient is a 72 year old male now seen out over 2 years having completed IMRT radiation therapy for Gleason 7 adenocarcinoma the prostate presenting with a PSA in the 10 range.  Seen today in routine follow-up he is doing well specifically denies any increased lower urinary tract symptoms diarrhea or fatigue.  His PSA remains stable over time most recently was 1.1 up slightly from 0.946 months prior..  COMPLICATIONS OF TREATMENT: none  FOLLOW UP COMPLIANCE: keeps appointments   PHYSICAL EXAM:  There were no vitals taken for this visit. Well-developed well-nourished patient in NAD. HEENT reveals PERLA, EOMI, discs not visualized.  Oral cavity is clear. No oral mucosal lesions are identified. Neck is clear without evidence of cervical or supraclavicular adenopathy. Lungs are clear to A&P. Cardiac examination is essentially unremarkable with regular rate and rhythm without murmur rub or thrill. Abdomen is benign with no organomegaly or masses noted. Motor sensory and DTR levels are equal and symmetric in the upper and lower extremities. Cranial nerves II through XII are grossly intact. Proprioception is intact. No peripheral adenopathy or edema is identified. No motor or sensory levels are noted. Crude visual fields are within normal range.  RADIOLOGY RESULTS: No current films for review  PLAN: Present time patient is doing well and excellent biochemical control of his prostate cancer.  He follows up with Dr. [name] and I have asked to see him back in 1 year for follow-up with repeat PSA at that time if has not already been performed.  Patient  comprehends my recommendations well.  I would like to take this opportunity to thank you for allowing me to participate in the care of your patient.Carmina Miller, MD

## 2023-01-11 NOTE — Assessment & Plan Note (Addendum)
Prostate cancer status post definitive radiation.   PSA trending up due to biochemical recurrence, increase of 2 ng/ ml above nadir.  Interval between radiation and recurrence less than 18 months.  High risk. PSMA PET scan showed nonspecific prostate activity.  No definitive radiographic evidence of recurrence. PSA mildly increased.  Continue observation.

## 2023-01-20 ENCOUNTER — Other Ambulatory Visit: Payer: Self-pay | Admitting: Internal Medicine

## 2023-01-20 DIAGNOSIS — E119 Type 2 diabetes mellitus without complications: Secondary | ICD-10-CM

## 2023-02-17 ENCOUNTER — Other Ambulatory Visit: Payer: Self-pay | Admitting: Internal Medicine

## 2023-02-17 DIAGNOSIS — E119 Type 2 diabetes mellitus without complications: Secondary | ICD-10-CM

## 2023-02-21 ENCOUNTER — Other Ambulatory Visit: Payer: Self-pay | Admitting: Internal Medicine

## 2023-02-21 DIAGNOSIS — I10 Essential (primary) hypertension: Secondary | ICD-10-CM

## 2023-02-23 MED ORDER — OLMESARTAN-AMLODIPINE-HCTZ 40-10-12.5 MG PO TABS
ORAL_TABLET | ORAL | 0 refills | Status: DC
Start: 2023-02-23 — End: 2023-05-21

## 2023-03-12 ENCOUNTER — Other Ambulatory Visit: Payer: Medicare Other

## 2023-03-13 ENCOUNTER — Other Ambulatory Visit: Payer: Medicare Other

## 2023-03-13 ENCOUNTER — Ambulatory Visit: Payer: Medicare Other | Admitting: Internal Medicine

## 2023-03-14 LAB — COMPREHENSIVE METABOLIC PANEL
ALT: 12 [IU]/L (ref 0–44)
AST: 16 [IU]/L (ref 0–40)
Albumin: 4.3 g/dL (ref 3.9–4.9)
Alkaline Phosphatase: 79 [IU]/L (ref 44–121)
BUN/Creatinine Ratio: 15 (ref 10–24)
BUN: 11 mg/dL (ref 8–27)
Bilirubin Total: 1.1 mg/dL (ref 0.0–1.2)
CO2: 25 mmol/L (ref 20–29)
Calcium: 9.6 mg/dL (ref 8.6–10.2)
Chloride: 105 mmol/L (ref 96–106)
Creatinine, Ser: 0.75 mg/dL — ABNORMAL LOW (ref 0.76–1.27)
Globulin, Total: 2.6 g/dL (ref 1.5–4.5)
Glucose: 115 mg/dL — ABNORMAL HIGH (ref 70–99)
Potassium: 3.8 mmol/L (ref 3.5–5.2)
Sodium: 144 mmol/L (ref 134–144)
Total Protein: 6.9 g/dL (ref 6.0–8.5)
eGFR: 97 mL/min/{1.73_m2} (ref >59)

## 2023-03-14 LAB — HEMOGLOBIN A1C
Est. average glucose Bld gHb Est-mCnc: 137 mg/dL
Hgb A1c MFr Bld: 6.4 % — ABNORMAL HIGH (ref 4.8–5.6)

## 2023-03-14 LAB — LIPID PANEL
Chol/HDL Ratio: 2.6 {ratio} (ref 0.0–5.0)
Cholesterol, Total: 114 mg/dL (ref 100–199)
HDL: 44 mg/dL (ref >39)
LDL Chol Calc (NIH): 58 mg/dL (ref 0–99)
Triglycerides: 51 mg/dL (ref 0–149)
VLDL Cholesterol Cal: 12 mg/dL (ref 5–40)

## 2023-03-21 ENCOUNTER — Ambulatory Visit (INDEPENDENT_AMBULATORY_CARE_PROVIDER_SITE_OTHER): Payer: Medicare Other | Admitting: Internal Medicine

## 2023-03-21 VITALS — BP 138/73 | HR 78 | Temp 97.3°F | Ht 70.0 in | Wt 211.0 lb

## 2023-03-21 DIAGNOSIS — I10 Essential (primary) hypertension: Secondary | ICD-10-CM

## 2023-03-21 DIAGNOSIS — E119 Type 2 diabetes mellitus without complications: Secondary | ICD-10-CM

## 2023-03-21 LAB — POCT CBG (FASTING - GLUCOSE)-MANUAL ENTRY: Glucose Fasting, POC: 217 mg/dL — AB (ref 70–99)

## 2023-03-21 MED ORDER — CARVEDILOL PHOSPHATE ER 20 MG PO CP24: 20.0000 mg | ORAL_CAPSULE | Freq: Every day | ORAL | 1 refills | Status: DC

## 2023-03-21 MED ORDER — METFORMIN HCL ER 500 MG PO TB24: 2000.0000 mg | ORAL_TABLET | Freq: Every day | ORAL | 1 refills | Status: DC

## 2023-03-21 NOTE — Progress Notes (Signed)
 Established Patient Office Visit  Subjective:  Patient ID: Jose Randolph, male    DOB: 10-Jul-1950  Age: 73 y.o. MRN: [id number]  No chief complaint on file.   No new complaints, here for lab review and medication refills. Labs reviewed and notable for well controlled diabetes, A1c at target, lipids at target with unremarkable cmp. Denies any hypoglycemic episodes and home bg readings have been at target.   No other concerns at this time.   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 Excised, margin involved.    Past Surgical History:  Procedure Laterality Date   COLONOSCOPY WITH PROPOFOL  N/A 10/28/2019   Procedure: COLONOSCOPY WITH PROPOFOL ;  Surgeon: Shane Darling, MD;  Location: [facility]OSCOPY;  Service: Endoscopy;  Laterality: N/A;   FRACTURE SURGERY      Social History   Socioeconomic History   Marital status: Married    Spouse name: Not on file   Number of children: Not on file   Years of education: Not on file   Highest education level: Not on file  Occupational History   Not on file  Tobacco Use   Smoking status: Former    Current packs/day: 0.00    Average packs/day: 0.5 packs/day for 5.0 years (2.5 ttl pk-yrs)    Types: Cigarettes    Start date: 03/06/1978    Quit date: 03/07/1983    Years since quitting: 40.0   Smokeless tobacco: Never  Vaping Use   Vaping status: Never Used  Substance and Sexual Activity   Alcohol use: Never   Drug use: Never   Sexual activity: Yes    Birth control/protection: None  Other Topics Concern   Not on file  Social History Narrative   Not on file   Social Drivers of Health   Financial Resource Strain: Not on file  Food Insecurity: Not on file  Transportation Needs: Not on file  Physical Activity: Not on file  Stress: Not on file  Social Connections: Not  on file  Intimate Partner Violence: Not on file    Family History  Problem Relation Age of Onset   Colon cancer Father        dx 54s   Prostate cancer Brother 63   Prostate cancer Brother 5   Kidney cancer Brother 59   Cancer Maternal Aunt        unk type   Lung cancer Maternal Uncle    Leukemia Grandson 2    Allergies  Allergen Reactions   Oxycodone    Bee Pollen Itching    Outpatient Medications Prior to Visit  Medication Sig   atorvastatin  (LIPITOR) 40 MG tablet TAKE 1 TABLET BY MOUTH EVERY DAY   Cholecalciferol 25 MCG (1000 UT) tablet Take by mouth.   FARXIGA  10 MG TABS tablet TAKE 1 TABLET BY MOUTH EVERY DAY BEFORE BREAKFAST   halobetasol (ULTRAVATE) 0.05 % ointment Apply topically.   Lancets (ONETOUCH ULTRASOFT) lancets USE WITH DEVICE TO CHECK SUGARS TWICE DAILY DX E11.9   Mechlorethamine HCl (VALCHLOR) 0.016 % GEL Apply topically.   Olmesartan -amLODIPine -HCTZ 40-10-12.5 MG TABS TAKE 1 TABLET BY MOUTH EVERY DAY IN THE MORNING   ONETOUCH ULTRA test strip 2 (two) times daily.   tadalafil (CIALIS) 20 MG tablet EVERY 36 HOURS AS NEEDED FOR ED.   tamsulosin  (FLOMAX ) 0.4 MG CAPS  capsule TAKE 1 CAPSULE BY MOUTH 2 TIMES DAILY.   [DISCONTINUED] carvedilol  (COREG  CR) 20 MG 24 hr capsule TAKE 1 CAPSULE BY MOUTH EVERY DAY IN THE MORNING   [DISCONTINUED] metFORMIN  (GLUCOPHAGE -XR) 500 MG 24 hr tablet TAKE 4 TABLETS BY MOUTH DAILY WITH BREAKFAST   No facility-administered medications prior to visit.    Review of Systems  Constitutional: Negative.   HENT: Negative.    Eyes: Negative.   Respiratory: Negative.    Cardiovascular: Negative.   Gastrointestinal: Negative.   Genitourinary: Negative.   Skin: Negative.   Neurological: Negative.   Endo/Heme/Allergies: Negative.        Objective:   BP 138/73   Pulse 78   Temp (!) 97.3 F (36.3 C)   Ht 5\' 10"  (1.778 m)   Wt 211 lb (95.7 kg)   SpO2 97%   BMI 30.28 kg/m   Vitals:   03/21/23 0825  BP: 138/73  Pulse: 78   Temp: (!) 97.3 F (36.3 C)  Height: 5\' 10"  (1.778 m)  Weight: 211 lb (95.7 kg)  SpO2: 97%  BMI (Calculated): 30.28    Physical Exam Vitals reviewed.  Constitutional:      Appearance: Normal appearance.  HENT:     Head: Normocephalic.     Left Ear: There is no impacted cerumen.     Nose: Nose normal.     Mouth/Throat:     Mouth: Mucous membranes are moist.     Pharynx: No posterior oropharyngeal erythema.  Eyes:     Extraocular Movements: Extraocular movements intact.     Pupils: Pupils are equal, round, and reactive to light.  Cardiovascular:     Rate and Rhythm: Regular rhythm.     Chest Wall: PMI is not displaced.     Pulses: Normal pulses.     Heart sounds: Normal heart sounds. No murmur heard. Pulmonary:     Effort: Pulmonary effort is normal.     Breath sounds: Normal air entry. No rhonchi or rales.  Abdominal:     General: Abdomen is flat. Bowel sounds are normal. There is no distension.     Palpations: Abdomen is soft. There is no hepatomegaly, splenomegaly or mass.     Tenderness: There is no abdominal tenderness.  Musculoskeletal:        General: Normal range of motion.     Cervical back: Normal range of motion and neck supple.     Right lower leg: No edema.     Left lower leg: No edema.  Skin:    General: Skin is warm and dry.  Neurological:     General: No focal deficit present.     Mental Status: He is alert and oriented to person, place, and time.     Cranial Nerves: No cranial nerve deficit.     Motor: No weakness.  Psychiatric:        Mood and Affect: Mood normal.        Behavior: Behavior normal.      Results for orders placed or performed in visit on 03/21/23  POCT CBG (Fasting - Glucose)  Result Value Ref Range   Glucose Fasting, POC 217 (A) 70 - 99 mg/dL    Recent Results (from the past 2160 hours)  PSA     Status: None   Collection Time: 01/08/23  8:13 AM  Result Value Ref Range   Prostatic Specific Antigen 1.11 0.00 - 4.00 ng/mL     Comment: (NOTE) While PSA levels of <=4.00 ng/ml  are reported as reference range, some men with levels below 4.00 ng/ml can have prostate cancer and many men with PSA above 4.00 ng/ml do not have prostate cancer.  Other tests such as free PSA, age specific reference ranges, PSA velocity and PSA doubling time may be helpful especially in men less than 62 years old. Performed at [facility], [address] [place], [place] [address]   CBC with Differential (Cancer Center Only)     Status: Abnormal   Collection Time: 01/08/23  8:13 AM  Result Value Ref Range   WBC Count 4.8 4.0 - 10.5 K/uL   RBC 4.27 4.22 - 5.81 MIL/uL   Hemoglobin 13.3 13.0 - 17.0 g/dL   HCT 62.1 (L) 30.8 - 65.7 %   MCV 90.9 80.0 - 100.0 fL   MCH 31.1 26.0 - 34.0 pg   MCHC 34.3 30.0 - 36.0 g/dL   RDW 84.6 96.2 - 95.2 %   Platelet Count 177 150 - 400 K/uL   nRBC 0.0 0.0 - 0.2 %   Neutrophils Relative % 54 %   Neutro Abs 2.6 1.7 - 7.7 K/uL   Lymphocytes Relative 31 %   Lymphs Abs 1.5 0.7 - 4.0 K/uL   Monocytes Relative 11 %   Monocytes Absolute 0.5 0.1 - 1.0 K/uL   Eosinophils Relative 3 %   Eosinophils Absolute 0.1 0.0 - 0.5 K/uL   Basophils Relative 1 %   Basophils Absolute 0.0 0.0 - 0.1 K/uL   Immature Granulocytes 0 %   Abs Immature Granulocytes 0.01 0.00 - 0.07 K/uL    Comment: Performed at [facility], [address] [place], [place] [address]  CMP (Cancer Center only)     Status: Abnormal   Collection Time: 01/08/23  8:13 AM  Result Value Ref Range   Sodium 138 135 - 145 mmol/L   Potassium 3.5 3.5 - 5.1 mmol/L   Chloride 105 98 - 111 mmol/L   CO2 25 22 - 32 mmol/L   Glucose, Bld 116 (H) 70 - 99 mg/dL    Comment: Glucose reference range applies only to samples taken after fasting for at least 8 hours.   BUN 14 8 - 23 mg/dL   Creatinine 4.40 1.02 - 1.24 mg/dL   Calcium  9.4 8.9 - 10.3 mg/dL   Total Protein 7.2 6.5 - 8.1 g/dL   Albumin 4.0 3.5 - 5.0 g/dL   AST 16 15 - 41 U/L   ALT  15 0 - 44 U/L   Alkaline Phosphatase 64 38 - 126 U/L   Total Bilirubin 1.3 (H) <1.2 mg/dL   GFR, Estimated >72 >53 mL/min    Comment: (NOTE) Calculated using the CKD-EPI Creatinine Equation (2021)    Anion gap 8 5 - 15    Comment: Performed at [facility], [address] [place], [place] [address]  Comprehensive metabolic panel     Status: Abnormal   Collection Time: 03/13/23  8:35 AM  Result Value Ref Range   Glucose 115 (H) 70 - 99 mg/dL   BUN 11 8 - 27 mg/dL   Creatinine, Ser 3.47 (L) 0.76 - 1.27 mg/dL   eGFR 97 >42 VZ/DGL/8.75   BUN/Creatinine Ratio 15 10 - 24   Sodium 144 134 - 144 mmol/L   Potassium 3.8 3.5 - 5.2 mmol/L   Chloride 105 96 - 106 mmol/L   CO2 25 20 - 29 mmol/L   Calcium  9.6 8.6 - 10.2 mg/dL   Total Protein  6.9 6.0 - 8.5 g/dL   Albumin 4.3 3.9 - 4.9 g/dL   Globulin, Total 2.6 1.5 - 4.5 g/dL   Bilirubin Total 1.1 0.0 - 1.2 mg/dL   Alkaline Phosphatase 79 44 - 121 IU/L   AST 16 0 - 40 IU/L   ALT 12 0 - 44 IU/L  Lipid panel     Status: None   Collection Time: 03/13/23  8:36 AM  Result Value Ref Range   Cholesterol, Total 114 100 - 199 mg/dL   Triglycerides 51 0 - 149 mg/dL   HDL 44 >16 mg/dL   VLDL Cholesterol Cal 12 5 - 40 mg/dL   LDL Chol Calc (NIH) 58 0 - 99 mg/dL   Chol/HDL Ratio 2.6 0.0 - 5.0 ratio    Comment:                                   T. Chol/HDL Ratio                                             Men  Women                               1/2 Avg.Risk  3.4    3.3                                   Avg.Risk  5.0    4.4                                2X Avg.Risk  9.6    7.1                                3X Avg.Risk 23.4   11.0   Hemoglobin A1c     Status: Abnormal   Collection Time: 03/13/23  8:36 AM  Result Value Ref Range   Hgb A1c MFr Bld 6.4 (H) 4.8 - 5.6 %    Comment:          Prediabetes: 5.7 - 6.4          Diabetes: >6.4          Glycemic control for adults with diabetes: <7.0    Est. average glucose Bld gHb Est-mCnc 137  mg/dL  POCT CBG (Fasting - Glucose)     Status: Abnormal   Collection Time: 03/21/23  8:28 AM  Result Value Ref Range   Glucose Fasting, POC 217 (A) 70 - 99 mg/dL      Assessment & Plan:  As per problem list  Problem List Items Addressed This Visit       Endocrine   Type 2 diabetes mellitus without complication, without long-term current use of insulin (HCC) - Primary   Relevant Medications   metFORMIN  (GLUCOPHAGE -XR) 500 MG 24 hr tablet   Other Relevant Orders   POCT CBG (Fasting - Glucose) (Completed)   Other Visit Diagnoses       Essential (primary) hypertension       Relevant Medications   carvedilol  (COREG  CR) 20 MG  24 hr capsule       Return in about 3 months (around 06/19/2023) for fu with labs prior.   Total time spent: 20 minutes  Arzella Bitters, MD  03/21/2023   This document may have been prepared by [facility] Voice Recognition software and as such may include unintentional dictation errors.

## 2023-05-21 ENCOUNTER — Other Ambulatory Visit: Payer: Self-pay | Admitting: Internal Medicine

## 2023-05-21 DIAGNOSIS — I10 Essential (primary) hypertension: Secondary | ICD-10-CM

## 2023-06-05 ENCOUNTER — Other Ambulatory Visit: Payer: Self-pay | Admitting: Internal Medicine

## 2023-06-05 DIAGNOSIS — E119 Type 2 diabetes mellitus without complications: Secondary | ICD-10-CM

## 2023-06-18 ENCOUNTER — Other Ambulatory Visit

## 2023-06-18 DIAGNOSIS — E782 Mixed hyperlipidemia: Secondary | ICD-10-CM

## 2023-06-18 DIAGNOSIS — E119 Type 2 diabetes mellitus without complications: Secondary | ICD-10-CM

## 2023-06-19 LAB — HEMOGLOBIN A1C
Est. average glucose Bld gHb Est-mCnc: 146 mg/dL
Hgb A1c MFr Bld: 6.7 % — ABNORMAL HIGH (ref 4.8–5.6)

## 2023-06-19 LAB — LIPID PANEL
Chol/HDL Ratio: 2.5 ratio (ref 0.0–5.0)
Cholesterol, Total: 121 mg/dL (ref 100–199)
HDL: 49 mg/dL (ref >39)
LDL Chol Calc (NIH): 61 mg/dL (ref 0–99)
Triglycerides: 43 mg/dL (ref 0–149)
VLDL Cholesterol Cal: 11 mg/dL (ref 5–40)

## 2023-06-19 LAB — COMPREHENSIVE METABOLIC PANEL WITH GFR
ALT: 31 IU/L (ref 0–44)
AST: 29 IU/L (ref 0–40)
Albumin: 4 g/dL (ref 3.9–4.9)
Alkaline Phosphatase: 76 IU/L (ref 44–121)
BUN/Creatinine Ratio: 18 (ref 10–24)
BUN: 13 mg/dL (ref 8–27)
Bilirubin Total: 0.8 mg/dL (ref 0.0–1.2)
CO2: 23 mmol/L (ref 20–29)
Calcium: 9.6 mg/dL (ref 8.6–10.2)
Chloride: 106 mmol/L (ref 96–106)
Creatinine, Ser: 0.72 mg/dL — ABNORMAL LOW (ref 0.76–1.27)
Globulin, Total: 2.9 g/dL (ref 1.5–4.5)
Glucose: 139 mg/dL — ABNORMAL HIGH (ref 70–99)
Potassium: 3.7 mmol/L (ref 3.5–5.2)
Sodium: 143 mmol/L (ref 134–144)
Total Protein: 6.9 g/dL (ref 6.0–8.5)
eGFR: 98 mL/min/{1.73_m2} (ref >59)

## 2023-06-19 LAB — CK: Total CK: 124 U/L (ref 41–331)

## 2023-06-19 LAB — HEPATIC FUNCTION PANEL: Bilirubin, Direct: 0.26 mg/dL (ref 0.00–0.40)

## 2023-06-20 ENCOUNTER — Ambulatory Visit: Payer: Medicare Other | Admitting: Internal Medicine

## 2023-06-20 ENCOUNTER — Encounter: Payer: Self-pay | Admitting: Internal Medicine

## 2023-06-20 VITALS — BP 136/76 | HR 77 | Temp 95.7°F | Ht 70.0 in | Wt 218.0 lb

## 2023-06-20 DIAGNOSIS — E119 Type 2 diabetes mellitus without complications: Secondary | ICD-10-CM | POA: Diagnosis not present

## 2023-06-20 DIAGNOSIS — E782 Mixed hyperlipidemia: Secondary | ICD-10-CM

## 2023-06-20 DIAGNOSIS — I10 Essential (primary) hypertension: Secondary | ICD-10-CM

## 2023-06-20 LAB — POCT CBG (FASTING - GLUCOSE)-MANUAL ENTRY: Glucose Fasting, POC: 138 mg/dL — AB (ref 70–99)

## 2023-06-20 MED ORDER — DAPAGLIFLOZIN PROPANEDIOL 10 MG PO TABS: 10.0000 mg | ORAL_TABLET | Freq: Every day | ORAL | 0 refills | Status: DC

## 2023-06-20 MED ORDER — ATORVASTATIN CALCIUM 40 MG PO TABS: 40.0000 mg | ORAL_TABLET | Freq: Every day | ORAL | 1 refills | Status: DC

## 2023-06-20 NOTE — Progress Notes (Signed)
 Established Patient Office Visit  Subjective:  Patient ID: Jose Randolph, male    DOB: Sep 06, 1950  Age: 73 y.o. MRN: [id number]  Chief Complaint  Patient presents with   Follow-up    3 month follow up    No new complaints, here for lab review and medication refills. Labs reviewed and notable for well controlled diabetes, A1c higher but still at target, lipids at target with unremarkable cmp. Denies any hypoglycemic episodes and home bg readings have been at target.      No other concerns at this time.   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 Excised, margin involved.    Past Surgical History:  Procedure Laterality Date   COLONOSCOPY WITH PROPOFOL N/A 10/28/2019   Procedure: COLONOSCOPY WITH PROPOFOL;  Surgeon: Regis Bill, MD;  Location: [facility] ENDOSCOPY;  Service: Endoscopy;  Laterality: N/A;   FRACTURE SURGERY      Social History   Socioeconomic History   Marital status: Married    Spouse name: Not on file   Number of children: Not on file   Years of education: Not on file   Highest education level: Not on file  Occupational History   Not on file  Tobacco Use   Smoking status: Former    Current packs/day: 0.00    Average packs/day: 0.5 packs/day for 5.0 years (2.5 ttl pk-yrs)    Types: Cigarettes    Start date: 03/06/1978    Quit date: 03/07/1983    Years since quitting: 40.3   Smokeless tobacco: Never  Vaping Use   Vaping status: Never Used  Substance and Sexual Activity   Alcohol use: Never   Drug use: Never   Sexual activity: Yes    Birth control/protection: None  Other Topics Concern   Not on file  Social History Narrative   Not on file   Social Drivers of Health   Financial Resource Strain: Not on file  Food Insecurity: Not on file  Transportation Needs: Not on file  Physical  Activity: Not on file  Stress: Not on file  Social Connections: Not on file  Intimate Partner Violence: Not on file    Family History  Problem Relation Age of Onset   Colon cancer Father        dx 62s   Prostate cancer Brother 63   Prostate cancer Brother 2   Kidney cancer Brother 59   Cancer Maternal Aunt        unk type   Lung cancer Maternal Uncle    Leukemia Grandson 2    Allergies  Allergen Reactions   Oxycodone    Bee Pollen Itching    Outpatient Medications Prior to Visit  Medication Sig   carvedilol (COREG CR) 20 MG 24 hr capsule Take 1 capsule (20 mg total) by mouth daily. TAKE 1 CAPSULE BY MOUTH EVERY DAY IN THE MORNING   Cholecalciferol 25 MCG (1000 UT) tablet Take by mouth.   halobetasol (ULTRAVATE) 0.05 % ointment Apply topically.   Lancets (ONETOUCH ULTRASOFT) lancets USE WITH DEVICE TO CHECK SUGARS TWICE DAILY DX E11.9   Mechlorethamine HCl (VALCHLOR) 0.016 % GEL Apply topically.   metFORMIN (GLUCOPHAGE-XR) 500 MG 24 hr tablet Take 4 tablets (2,000 mg total) by mouth daily with breakfast. TAKE 4 TABLETS BY MOUTH DAILY WITH BREAKFAST  Olmesartan-amLODIPine-HCTZ 40-10-12.5 MG TABS TAKE 1 TABLET BY MOUTH EVERY DAY IN THE MORNING   ONETOUCH ULTRA test strip 2 (two) times daily.   tadalafil (CIALIS) 20 MG tablet EVERY 36 HOURS AS NEEDED FOR ED.   tamsulosin (FLOMAX) 0.4 MG CAPS capsule TAKE 1 CAPSULE BY MOUTH 2 TIMES DAILY.   [DISCONTINUED] atorvastatin (LIPITOR) 40 MG tablet TAKE 1 TABLET BY MOUTH EVERY DAY   [DISCONTINUED] dapagliflozin propanediol (FARXIGA) 10 MG TABS tablet TAKE 1 TABLET BY MOUTH EVERY DAY BEFORE BREAKFAST   No facility-administered medications prior to visit.    Review of Systems  Constitutional: Negative.  Negative for weight loss (gained 7 lbs).  HENT: Negative.    Eyes: Negative.   Respiratory: Negative.    Cardiovascular: Negative.   Gastrointestinal: Negative.   Genitourinary: Negative.   Skin: Negative.   Neurological:  Negative.   Endo/Heme/Allergies: Negative.        Objective:   BP 136/76   Pulse 77   Temp (!) 95.7 F (35.4 C) (Tympanic)   Ht 5\' 10"  (1.778 m)   Wt 218 lb (98.9 kg)   SpO2 94%   BMI 31.28 kg/m   Vitals:   06/20/23 0825  BP: 136/76  Pulse: 77  Temp: (!) 95.7 F (35.4 C)  Height: 5\' 10"  (1.778 m)  Weight: 218 lb (98.9 kg)  SpO2: 94%  TempSrc: Tympanic  BMI (Calculated): 31.28    Physical Exam Vitals reviewed.  Constitutional:      Appearance: Normal appearance.  HENT:     Head: Normocephalic.     Left Ear: There is no impacted cerumen.     Nose: Nose normal.     Mouth/Throat:     Mouth: Mucous membranes are moist.     Pharynx: No posterior oropharyngeal erythema.  Eyes:     Extraocular Movements: Extraocular movements intact.     Pupils: Pupils are equal, round, and reactive to light.  Cardiovascular:     Rate and Rhythm: Regular rhythm.     Chest Wall: PMI is not displaced.     Pulses: Normal pulses.     Heart sounds: Normal heart sounds. No murmur heard. Pulmonary:     Effort: Pulmonary effort is normal.     Breath sounds: Normal air entry. No rhonchi or rales.  Abdominal:     General: Abdomen is flat. Bowel sounds are normal. There is no distension.     Palpations: Abdomen is soft. There is no hepatomegaly, splenomegaly or mass.     Tenderness: There is no abdominal tenderness.  Musculoskeletal:        General: Normal range of motion.     Cervical back: Normal range of motion and neck supple.     Right lower leg: No edema.     Left lower leg: No edema.  Skin:    General: Skin is warm and dry.  Neurological:     General: No focal deficit present.     Mental Status: He is alert and oriented to person, place, and time.     Cranial Nerves: No cranial nerve deficit.     Motor: No weakness.  Psychiatric:        Mood and Affect: Mood normal.        Behavior: Behavior normal.      Results for orders placed or performed in visit on 06/20/23  POCT  CBG (Fasting - Glucose)  Result Value Ref Range   Glucose Fasting, POC 138 (A) 70 - 99 mg/dL  Recent Results (from the past 2160 hours)  Hepatic function panel     Status: None   Collection Time: 06/18/23  8:26 AM  Result Value Ref Range   Bilirubin, Direct 0.26 0.00 - 0.40 mg/dL  Hemoglobin Z6X     Status: Abnormal   Collection Time: 06/18/23  8:26 AM  Result Value Ref Range   Hgb A1c MFr Bld 6.7 (H) 4.8 - 5.6 %    Comment:          Prediabetes: 5.7 - 6.4          Diabetes: >6.4          Glycemic control for adults with diabetes: <7.0    Est. average glucose Bld gHb Est-mCnc 146 mg/dL  CK     Status: None   Collection Time: 06/18/23  8:26 AM  Result Value Ref Range   Total CK 124 41 - 331 U/L  Comprehensive metabolic panel     Status: Abnormal   Collection Time: 06/18/23  8:26 AM  Result Value Ref Range   Glucose 139 (H) 70 - 99 mg/dL   BUN 13 8 - 27 mg/dL   Creatinine, Ser 0.96 (L) 0.76 - 1.27 mg/dL   eGFR 98 >04 VW/UJW/1.19   BUN/Creatinine Ratio 18 10 - 24   Sodium 143 134 - 144 mmol/L   Potassium 3.7 3.5 - 5.2 mmol/L   Chloride 106 96 - 106 mmol/L   CO2 23 20 - 29 mmol/L   Calcium 9.6 8.6 - 10.2 mg/dL   Total Protein 6.9 6.0 - 8.5 g/dL   Albumin 4.0 3.9 - 4.9 g/dL   Globulin, Total 2.9 1.5 - 4.5 g/dL   Bilirubin Total 0.8 0.0 - 1.2 mg/dL   Alkaline Phosphatase 76 44 - 121 IU/L   AST 29 0 - 40 IU/L   ALT 31 0 - 44 IU/L  Lipid panel     Status: None   Collection Time: 06/18/23  8:26 AM  Result Value Ref Range   Cholesterol, Total 121 100 - 199 mg/dL   Triglycerides 43 0 - 149 mg/dL   HDL 49 >14 mg/dL   VLDL Cholesterol Cal 11 5 - 40 mg/dL   LDL Chol Calc (NIH) 61 0 - 99 mg/dL   Chol/HDL Ratio 2.5 0.0 - 5.0 ratio    Comment:                                   T. Chol/HDL Ratio                                             Men  Women                               1/2 Avg.Risk  3.4    3.3                                   Avg.Risk  5.0    4.4                                 2X Avg.Risk  9.6  7.1                                3X Avg.Risk 23.4   11.0   POCT CBG (Fasting - Glucose)     Status: Abnormal   Collection Time: 06/20/23  8:30 AM  Result Value Ref Range   Glucose Fasting, POC 138 (A) 70 - 99 mg/dL      Assessment & Plan:  As per problem list. Stricter low calorie diet, low cholesterol and low fat diet and exercise as much as possible.  Problem List Items Addressed This Visit       Endocrine   Type 2 diabetes mellitus without complication, without long-term current use of insulin (HCC) - Primary   Relevant Medications   atorvastatin (LIPITOR) 40 MG tablet   dapagliflozin propanediol (FARXIGA) 10 MG TABS tablet   Other Relevant Orders   POCT CBG (Fasting - Glucose) (Completed)     Other   Mixed hyperlipidemia   Relevant Medications   atorvastatin (LIPITOR) 40 MG tablet   Other Visit Diagnoses       Type 2 diabetes mellitus without complications (HCC)       Relevant Medications   atorvastatin (LIPITOR) 40 MG tablet   dapagliflozin propanediol (FARXIGA) 10 MG TABS tablet   Other Relevant Orders   POCT CBG (Fasting - Glucose) (Completed)       Return in about 3 months (around 09/19/2023) for awv with labs prior.   Total time spent: 20 minutes  Arzella Bitters, MD  06/20/2023   This document may have been prepared by [facility] Voice Recognition software and as such may include unintentional dictation errors.

## 2023-06-24 ENCOUNTER — Other Ambulatory Visit: Payer: Self-pay | Admitting: Internal Medicine

## 2023-06-24 DIAGNOSIS — I10 Essential (primary) hypertension: Secondary | ICD-10-CM

## 2023-06-29 ENCOUNTER — Encounter: Payer: Self-pay | Admitting: Cardiology

## 2023-06-29 ENCOUNTER — Ambulatory Visit: Admitting: Cardiology

## 2023-06-29 VITALS — BP 130/80 | HR 82 | Ht 70.0 in | Wt 219.6 lb

## 2023-06-29 DIAGNOSIS — Z0130 Encounter for examination of blood pressure without abnormal findings: Secondary | ICD-10-CM

## 2023-06-29 DIAGNOSIS — L03115 Cellulitis of right lower limb: Secondary | ICD-10-CM | POA: Diagnosis not present

## 2023-06-29 MED ORDER — CEPHALEXIN 500 MG PO CAPS: 500.0000 mg | ORAL_CAPSULE | Freq: Two times a day (BID) | ORAL | 0 refills | Status: DC

## 2023-06-29 NOTE — Progress Notes (Signed)
 Established Patient Office Visit  Subjective:  Patient ID: Jose Randolph, male    DOB: 09/25/1950  Age: 73 y.o. MRN: [id number]  Chief Complaint  Patient presents with   Acute Visit    Sore that isn't healing    Patient in office for an acute visit, complaining of a sore on his lower right leg that is not healing. Patient reports having a history of dry, scaly, itchy patches on his arms and legs that come and go. Patient states he never gets them on his abdomen, back, or buttocks. Typically, areas heal on their own, this area is not healing per patient. 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 Recommend following up with dermatology. Will send in keflex, keep area dry and clean.     No other concerns at this time.   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 Excised, margin involved.    Past Surgical History:  Procedure Laterality Date   COLONOSCOPY WITH PROPOFOL  N/A 10/28/2019   Procedure: COLONOSCOPY WITH PROPOFOL ;  Surgeon: Shane Darling, MD;  Location: [facility];  Service: Endoscopy;  Laterality: N/A;   FRACTURE SURGERY      Social History   Socioeconomic History   Marital status: Married    Spouse name: Not on file   Number of children: Not on file   Years of education: Not on file   Highest education level: Not on file  Occupational History   Not on file  Tobacco Use   Smoking status: Former    Current packs/day: 0.00    Average packs/day: 0.5 packs/day for 5.0 years (2.5 ttl pk-yrs)    Types: Cigarettes    Start date: 03/06/1978    Quit date: 03/07/1983    Years since quitting: 40.3   Smokeless tobacco: Never  Vaping Use   Vaping status: Never Used  Substance and Sexual Activity   Alcohol use: Never   Drug use: Never    Sexual activity: Yes    Birth control/protection: None  Other Topics Concern   Not on file  Social History Narrative   Not on file   Social Drivers of Health   Financial Resource Strain: Not on file  Food Insecurity: Not on file  Transportation Needs: Not on file  Physical Activity: Not on file  Stress: Not on file  Social Connections: Not on file  Intimate Partner Violence: Not on file    Family History  Problem Relation Age of Onset   Colon cancer Father        dx 25s   Prostate cancer Brother 63   Prostate cancer Brother 67   Kidney cancer Brother 59   Cancer Maternal Aunt        unk type   Lung cancer Maternal Uncle    Leukemia Grandson 2    Allergies  Allergen Reactions   Oxycodone    Bee Pollen Itching    Outpatient Medications Prior to Visit  Medication Sig   atorvastatin  (LIPITOR) 40 MG tablet Take 1 tablet (40 mg total) by mouth daily.   carvedilol  (COREG  CR) 20 MG 24 hr capsule TAKE 1 CAPSULE BY MOUTH EVERY DAY IN THE MORNING   Cholecalciferol 25 MCG (1000 UT) tablet Take by mouth.   dapagliflozin  propanediol (FARXIGA ) 10 MG TABS  tablet Take 1 tablet (10 mg total) by mouth daily.   halobetasol (ULTRAVATE) 0.05 % ointment Apply topically.   Lancets (ONETOUCH ULTRASOFT) lancets USE WITH DEVICE TO CHECK SUGARS TWICE DAILY DX E11.9   Mechlorethamine HCl (VALCHLOR) 0.016 % GEL Apply topically.   metFORMIN  (GLUCOPHAGE -XR) 500 MG 24 hr tablet Take 4 tablets (2,000 mg total) by mouth daily with breakfast. TAKE 4 TABLETS BY MOUTH DAILY WITH BREAKFAST   Olmesartan -amLODIPine -HCTZ 40-10-12.5 MG TABS TAKE 1 TABLET BY MOUTH EVERY DAY IN THE MORNING   ONETOUCH ULTRA test strip 2 (two) times daily.   tadalafil (CIALIS) 20 MG tablet EVERY 36 HOURS AS NEEDED FOR ED.   tamsulosin  (FLOMAX ) 0.4 MG CAPS capsule TAKE 1 CAPSULE BY MOUTH 2 TIMES DAILY.   No facility-administered medications prior to visit.    Review of Systems  Constitutional: Negative.   HENT: Negative.     Eyes: Negative.   Respiratory: Negative.  Negative for shortness of breath.   Cardiovascular: Negative.  Negative for chest pain.  Gastrointestinal: Negative.  Negative for abdominal pain, constipation and diarrhea.  Genitourinary: Negative.   Musculoskeletal:  Negative for joint pain and myalgias.  Skin: Negative.   Neurological: Negative.  Negative for dizziness and headaches.  Endo/Heme/Allergies: Negative.   All other systems reviewed and are negative.      Objective:   BP 130/80   Pulse 82   Ht 5\' 10"  (1.778 m)   Wt 219 lb 9.6 oz (99.6 kg)   SpO2 96%   BMI 31.51 kg/m   Vitals:   06/29/23 1331  BP: 130/80  Pulse: 82  Height: 5\' 10"  (1.778 m)  Weight: 219 lb 9.6 oz (99.6 kg)  SpO2: 96%  BMI (Calculated): 31.51    Physical Exam Nursing note reviewed.  Constitutional:      Appearance: Normal appearance. He is normal weight.  HENT:     Head: Normocephalic and atraumatic.     Nose: Nose normal.     Mouth/Throat:     Mouth: Mucous membranes are moist.     Pharynx: Oropharynx is clear.  Eyes:     Extraocular Movements: Extraocular movements intact.     Conjunctiva/sclera: Conjunctivae normal.     Pupils: Pupils are equal, round, and reactive to light.  Cardiovascular:     Rate and Rhythm: Normal rate and regular rhythm.     Pulses: Normal pulses.     Heart sounds: Normal heart sounds.  Pulmonary:     Effort: Pulmonary effort is normal.     Breath sounds: Normal breath sounds.  Abdominal:     General: Abdomen is flat. Bowel sounds are normal.     Palpations: Abdomen is soft.  Musculoskeletal:        General: Normal range of motion.     Cervical back: Normal range of motion.     Right lower leg: No swelling, lacerations, tenderness or bony tenderness. No edema.       Legs:  Skin:    General: Skin is warm and dry.  Neurological:     General: No focal deficit present.     Mental Status: He is alert and oriented to person, place, and time.  Psychiatric:         Mood and Affect: Mood normal.        Behavior: Behavior normal.        Thought Content: Thought content normal.        Judgment: Judgment normal.      No results  found for any visits on 06/29/23.  Recent Results (from the past 2160 hours)  Hepatic function panel     Status: None   Collection Time: 06/18/23  8:26 AM  Result Value Ref Range   Bilirubin, Direct 0.26 0.00 - 0.40 mg/dL  Hemoglobin K4M     Status: Abnormal   Collection Time: 06/18/23  8:26 AM  Result Value Ref Range   Hgb A1c MFr Bld 6.7 (H) 4.8 - 5.6 %    Comment:          Prediabetes: 5.7 - 6.4          Diabetes: >6.4          Glycemic control for adults with diabetes: <7.0    Est. average glucose Bld gHb Est-mCnc 146 mg/dL  CK     Status: None   Collection Time: 06/18/23  8:26 AM  Result Value Ref Range   Total CK 124 41 - 331 U/L  Comprehensive metabolic panel     Status: Abnormal   Collection Time: 06/18/23  8:26 AM  Result Value Ref Range   Glucose 139 (H) 70 - 99 mg/dL   BUN 13 8 - 27 mg/dL   Creatinine, Ser 0.10 (L) 0.76 - 1.27 mg/dL   eGFR 98 >27 OZ/DGU/4.40   BUN/Creatinine Ratio 18 10 - 24   Sodium 143 134 - 144 mmol/L   Potassium 3.7 3.5 - 5.2 mmol/L   Chloride 106 96 - 106 mmol/L   CO2 23 20 - 29 mmol/L   Calcium  9.6 8.6 - 10.2 mg/dL   Total Protein 6.9 6.0 - 8.5 g/dL   Albumin 4.0 3.9 - 4.9 g/dL   Globulin, Total 2.9 1.5 - 4.5 g/dL   Bilirubin Total 0.8 0.0 - 1.2 mg/dL   Alkaline Phosphatase 76 44 - 121 IU/L   AST 29 0 - 40 IU/L   ALT 31 0 - 44 IU/L  Lipid panel     Status: None   Collection Time: 06/18/23  8:26 AM  Result Value Ref Range   Cholesterol, Total 121 100 - 199 mg/dL   Triglycerides 43 0 - 149 mg/dL   HDL 49 >34 mg/dL   VLDL Cholesterol Cal 11 5 - 40 mg/dL   LDL Chol Calc (NIH) 61 0 - 99 mg/dL   Chol/HDL Ratio 2.5 0.0 - 5.0 ratio    Comment:                                   T. Chol/HDL Ratio                                             Men  Women                                1/2 Avg.Risk  3.4    3.3                                   Avg.Risk  5.0    4.4  2X Avg.Risk  9.6    7.1                                3X Avg.Risk 23.4   11.0   POCT CBG (Fasting - Glucose)     Status: Abnormal   Collection Time: 06/20/23  8:30 AM  Result Value Ref Range   Glucose Fasting, POC 138 (A) 70 - 99 mg/dL      Assessment & Plan:  Make appointment with dermatology.  Keflex Keep area dry and clean  Problem List Items Addressed This Visit       Other   Cellulitis of right lower extremity - Primary    Return if symptoms worsen or fail to improve.   Total time spent: 25 minutes  [employer], NP  06/29/2023   This document may have been prepared by Dragon Voice Recognition software and as such may include unintentional dictation errors.

## 2023-07-02 ENCOUNTER — Other Ambulatory Visit: Payer: Self-pay | Admitting: Cardiology

## 2023-07-12 ENCOUNTER — Inpatient Hospital Stay: Payer: Medicare Other | Attending: Oncology

## 2023-07-12 DIAGNOSIS — Z8546 Personal history of malignant neoplasm of prostate: Secondary | ICD-10-CM | POA: Diagnosis present

## 2023-07-12 DIAGNOSIS — C61 Malignant neoplasm of prostate: Secondary | ICD-10-CM

## 2023-07-12 LAB — CBC WITH DIFFERENTIAL (CANCER CENTER ONLY)
Abs Immature Granulocytes: 0.03 10*3/uL (ref 0.00–0.07)
Basophils Absolute: 0.1 10*3/uL (ref 0.0–0.1)
Basophils Relative: 1 %
Eosinophils Absolute: 0.2 10*3/uL (ref 0.0–0.5)
Eosinophils Relative: 4 %
HCT: 41 % (ref 39.0–52.0)
Hemoglobin: 13.9 g/dL (ref 13.0–17.0)
Immature Granulocytes: 1 %
Lymphocytes Relative: 31 %
Lymphs Abs: 1.7 10*3/uL (ref 0.7–4.0)
MCH: 30.4 pg (ref 26.0–34.0)
MCHC: 33.9 g/dL (ref 30.0–36.0)
MCV: 89.7 fL (ref 80.0–100.0)
Monocytes Absolute: 0.7 10*3/uL (ref 0.1–1.0)
Monocytes Relative: 12 %
Neutro Abs: 2.8 10*3/uL (ref 1.7–7.7)
Neutrophils Relative %: 51 %
Platelet Count: 203 10*3/uL (ref 150–400)
RBC: 4.57 MIL/uL (ref 4.22–5.81)
RDW: 13.4 % (ref 11.5–15.5)
WBC Count: 5.5 10*3/uL (ref 4.0–10.5)
nRBC: 0 % (ref 0.0–0.2)

## 2023-07-12 LAB — CMP (CANCER CENTER ONLY)
ALT: 15 U/L (ref 0–44)
AST: 16 U/L (ref 15–41)
Albumin: 4 g/dL (ref 3.5–5.0)
Alkaline Phosphatase: 69 U/L (ref 38–126)
Anion gap: 9 (ref 5–15)
BUN: 17 mg/dL (ref 8–23)
CO2: 25 mmol/L (ref 22–32)
Calcium: 9.2 mg/dL (ref 8.9–10.3)
Chloride: 107 mmol/L (ref 98–111)
Creatinine: 0.8 mg/dL (ref 0.61–1.24)
GFR, Estimated: >60 mL/min (ref >60)
Glucose, Bld: 137 mg/dL — ABNORMAL HIGH (ref 70–99)
Potassium: 3.5 mmol/L (ref 3.5–5.1)
Sodium: 141 mmol/L (ref 135–145)
Total Bilirubin: 1 mg/dL (ref 0.0–1.2)
Total Protein: 7.4 g/dL (ref 6.5–8.1)

## 2023-07-12 LAB — PSA: Prostatic Specific Antigen: 1.52 ng/mL (ref 0.00–4.00)

## 2023-07-17 ENCOUNTER — Encounter: Payer: Self-pay | Admitting: Oncology

## 2023-07-17 ENCOUNTER — Inpatient Hospital Stay (HOSPITAL_BASED_OUTPATIENT_CLINIC_OR_DEPARTMENT_OTHER): Payer: Medicare Other | Admitting: Oncology

## 2023-07-17 VITALS — BP 125/70 | HR 70 | Temp 96.4°F | Resp 18 | Wt 217.2 lb

## 2023-07-17 DIAGNOSIS — C61 Malignant neoplasm of prostate: Secondary | ICD-10-CM

## 2023-07-17 DIAGNOSIS — Z8546 Personal history of malignant neoplasm of prostate: Secondary | ICD-10-CM | POA: Diagnosis not present

## 2023-07-17 NOTE — Progress Notes (Signed)
 Hematology/Oncology Progress note [phone] [phone]        REFERRING PROVIDER: Shari Daughters, MD   CHIEF COMPLAINTS/PURPOSE OF CONSULTATION:  Prostate cancer  ASSESSMENT & PLAN:   Cancer Staging  Prostate cancer [facility]) Staging form: Prostate, AJCC 8th Edition - Clinical stage from 04/21/2015: Stage IIB (cT1, cN0, cM0, PSA: 10, Grade Group: 2) - Signed by Timmy Forbes, MD on 12/20/2021   Prostate cancer [facility]) Prostate cancer status post definitive radiation.   PSA slowly trends up due to biochemical recurrence,  Previous PSMA PET scan showed nonspecific prostate activity.  No definitive radiographic evidence of recurrence.  Continue observation.   Orders Placed This Encounter  Procedures   CBC with Differential (Cancer Center Only)    Standing Status:   Future    Expected Date:   01/17/2024    Expiration Date:   07/16/2024   CMP (Cancer Center only)    Standing Status:   Future    Expected Date:   01/17/2024    Expiration Date:   07/16/2024   PSA    Standing Status:   Future    Expected Date:   01/17/2024    Expiration Date:   07/16/2024   Follow-up in 6 months. All questions were answered. The patient knows to call the clinic with any problems, questions or concerns.  Timmy Forbes, MD, PhD [facility] Hematology Oncology 07/17/2023       HISTORY OF PRESENTING ILLNESS:  Jose Randolph 73 y.o. male presents to establish care for  I have reviewed his chart and materials related to his cancer extensively and collaborated history with the patient. Summary of oncologic history is as follows: Oncology History  Prostate cancer (HCC)  04/21/2015 Cancer Staging   Staging form: Prostate, AJCC 8th Edition - Clinical stage from 04/21/2015: Stage IIB (cT1, cN0, cM0, PSA: 10, Grade Group: 2) - Signed by Timmy Forbes, MD on 12/20/2021 Stage prefix: Initial diagnosis Prostate specific antigen (PSA) range: 10 to 19 Gleason score: 7 Histologic grading system:  5 grade system   04/21/2015 Initial Diagnosis   Prostate cancer   -Per urologist who has obtained previous records, prostate biopsy 04/21/2015 which did show a focus of Gleason 3+3 adenocarcinoma in the left mid core involving 5% of the submitted tissue -Initiated patient elected active surveillance. -MRI prostate in 2017 showed no suspicious lesion in the prostate volume of 79 cc. -April 2018 biopsy showed benign tissue and a focus of atypical glands suspicious for adenocarcinoma right base.  PSA was elevated at 4.99     05/13/2018 Procedure   Per Urology note MR fusion biopsy [facility] 05/12/2020; prostate volume 72 g ROI biopsies x7 taken all showing benign prostate tissue LML core + Gleason 4+3 adenocarcinoma involving 15% of the submitted tissue LAL core + Gleason 3+3 adenocarcinoma involving 5% of submitted tissue Remaining biopsies with benign prostate tissue   03/31/2020 Tumor Marker   PSA 10.2.   04/14/2020 Imaging   MRI prostate with and without contrast -1. PI-RADS category 4 lesion of the left anterior peripheral zone in the mid gland and apex. 2. PI-RADS category 3 lesion of the right anterior and posterolateral peripheral zone at the apex. 3. Targeting data sent to [facility]. 4. Benign prostatic hypertrophy, prostate volume 102.85 cubic cm   06/16/2020 - 08/11/2020 Radiation Therapy   Prostate radiation   12/06/2020 Tumor Marker   PSA 0.11   06/06/2021 Tumor Marker   PSA 0.74   12/12/2021 Tumor Marker   PSA  2.33.   12/27/2021 Imaging   PSMA PET scan pending   01/31/2022 Genetic Testing   Negative genetic testing. No pathogenic variants identified on the [facility] Common Hereditary Cancers+RNA panel. The report date is 01/27/2022.  The Common Hereditary Cancers Panel + RNA offered by [facility] includes sequencing and/or deletion duplication testing of the following 48 genes: APC*, ATM*, AXIN2, BAP1, BARD1, BMPR1A, BRCA1, BRCA2, BRIP1, CDH1, CDK4, CDKN2A (p14ARF), CDKN2A  (p16INK4a), CHEK2, CTNNA1, DICER1*, EPCAM*, FH*, GREM1*, HOXB13, KIT, MBD4, MEN1*, MLH1*, MSH2*, MSH3*, MSH6*, MUTYH, NF1*, NTHL1, PALB2, PDGFRA, PMS2*, POLD1*, POLE, PTEN*, RAD51C, RAD51D, SDHA*, SDHB, SDHC*, SDHD, SMAD4, SMARCA4, STK11, TP53, TSC1*, TSC2, VHL.    04/03/2022 Tumor Marker   PSA 1.55    Patient was referred to establish care with medical oncology due to increase of PSA level. Patient was accompanied by wife.  Patient denies  urinary urgency, dysuria.  Occasionally patient has loose bowel movements, manageable with Imodium as needed.  Patient attributes to metformin  use.  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 Arabella [place] and is currently on phototherapy. Patient reported family history of prostate cancer and colon cancer.    INTERVAL HISTORY Jose Randolph is a 73 y.o. male who has above history reviewed by me today presents for follow up visit for prostate cancer.  He denies any new complaints.   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 Excised, margin involved.    SURGICAL HISTORY: Past Surgical History:  Procedure Laterality Date   COLONOSCOPY WITH PROPOFOL  N/A 10/28/2019   Procedure: COLONOSCOPY WITH PROPOFOL ;  Surgeon: Shane Darling, MD;  Location: [facility] ENDOSCOPY;  Service: Endoscopy;  Laterality: N/A;   FRACTURE SURGERY      SOCIAL HISTORY: Social History   Socioeconomic History   Marital status: Married    Spouse name: Not on file   Number of children: Not on file   Years of education: Not on file   Highest education level: Not on file  Occupational History   Not on file  Tobacco Use   Smoking status: Former    Current packs/day: 0.00    Average packs/day: 0.5 packs/day for 5.0 years (2.5 ttl pk-yrs)    Types:  Cigarettes    Start date: 03/06/1978    Quit date: 03/07/1983    Years since quitting: 40.3   Smokeless tobacco: Never  Vaping Use   Vaping status: Never Used  Substance and Sexual Activity   Alcohol use: Never   Drug use: Never   Sexual activity: Yes    Birth control/protection: None  Other Topics Concern   Not on file  Social History Narrative   Not on file   Social Drivers of Health   Financial Resource Strain: Not on file  Food Insecurity: Not on file  Transportation Needs: Not on file  Physical Activity: Not on file  Stress: Not on file  Social Connections: Not on file  Intimate Partner Violence: Not on file    FAMILY HISTORY: Family History  Problem Relation Age of Onset   Colon cancer Father        dx 49s   Prostate cancer Brother 78   Prostate cancer Brother 45   Kidney cancer Brother 53   Cancer Maternal Aunt  unk type   Lung cancer Maternal Uncle    Leukemia Grandson 2    ALLERGIES:  is allergic to oxycodone and bee pollen.  MEDICATIONS:  Current Outpatient Medications  Medication Sig Dispense Refill   atorvastatin  (LIPITOR) 40 MG tablet Take 1 tablet (40 mg total) by mouth daily. 90 tablet 1   carvedilol  (COREG  CR) 20 MG 24 hr capsule TAKE 1 CAPSULE BY MOUTH EVERY DAY IN THE MORNING 90 capsule 1   Cholecalciferol 25 MCG (1000 UT) tablet Take by mouth.     dapagliflozin  propanediol (FARXIGA ) 10 MG TABS tablet Take 1 tablet (10 mg total) by mouth daily. 90 tablet 0   halobetasol (ULTRAVATE) 0.05 % ointment Apply topically.     Lancets (ONETOUCH ULTRASOFT) lancets USE WITH DEVICE TO CHECK SUGARS TWICE DAILY DX E11.9     Mechlorethamine HCl (VALCHLOR) 0.016 % GEL Apply topically.     metFORMIN  (GLUCOPHAGE -XR) 500 MG 24 hr tablet Take 4 tablets (2,000 mg total) by mouth daily with breakfast. TAKE 4 TABLETS BY MOUTH DAILY WITH BREAKFAST 360 tablet 1   Olmesartan -amLODIPine -HCTZ 40-10-12.5 MG TABS TAKE 1 TABLET BY MOUTH EVERY DAY IN THE MORNING 90 tablet 0    ONETOUCH ULTRA test strip 2 (two) times daily.     tadalafil (CIALIS) 20 MG tablet EVERY 36 HOURS AS NEEDED FOR ED.     tamsulosin  (FLOMAX ) 0.4 MG CAPS capsule TAKE 1 CAPSULE BY MOUTH 2 TIMES DAILY. 180 capsule 3   No current facility-administered medications for this visit.    Review of Systems  Constitutional:  Negative for appetite change, chills, fatigue, fever and unexpected weight change.  HENT:   Negative for hearing loss and voice change.   Eyes:  Negative for eye problems and icterus.  Respiratory:  Negative for chest tightness, cough and shortness of breath.   Cardiovascular:  Negative for chest pain and leg swelling.  Gastrointestinal:  Negative for abdominal distention, abdominal pain and diarrhea.  Endocrine: Negative for hot flashes.  Genitourinary:  Negative for difficulty urinating, dysuria and frequency.   Musculoskeletal:  Negative for arthralgias.  Skin:  Negative for itching and rash.  Neurological:  Negative for light-headedness and numbness.  Hematological:  Negative for adenopathy. Does not bruise/bleed easily.  Psychiatric/Behavioral:  Negative for confusion.      PHYSICAL EXAMINATION: ECOG PERFORMANCE STATUS: 1 - Symptomatic but completely ambulatory  Vitals:   07/17/23 1015  BP: 125/70  Pulse: 70  Resp: 18  Temp: (!) 96.4 F (35.8 C)  SpO2: 98%   Filed Weights   07/17/23 1015  Weight: 217 lb 3.2 oz (98.5 kg)    Physical Exam Constitutional:      Appearance: Normal appearance. He is not diaphoretic.  HENT:     Head: Normocephalic and atraumatic.  Eyes:     General: No scleral icterus. Cardiovascular:     Rate and Rhythm: Normal rate and regular rhythm.  Pulmonary:     Effort: Pulmonary effort is normal. No respiratory distress.  Abdominal:     General: Bowel sounds are normal.     Palpations: Abdomen is soft.  Musculoskeletal:        General: Normal range of motion.     Cervical back: Normal range of motion and neck supple.   Skin:    General: Skin is warm and dry.     Findings: No erythema.  Neurological:     Mental Status: He is alert and oriented to person, place, and time. Mental status is at  baseline.     Cranial Nerves: No cranial nerve deficit.     Motor: No abnormal muscle tone.  Psychiatric:        Mood and Affect: Affect normal.      LABORATORY DATA:  I have reviewed the data as listed    Latest Ref Rng & Units 07/12/2023    8:03 AM 01/08/2023    8:13 AM 07/04/2022    7:57 AM  CBC  WBC 4.0 - 10.5 K/uL 5.5  4.8  4.8   Hemoglobin 13.0 - 17.0 g/dL 27.2  53.6  64.4   Hematocrit 39.0 - 52.0 % 41.0  38.8  40.7   Platelets 150 - 400 K/uL 203  177  182       Latest Ref Rng & Units 07/12/2023    8:03 AM 06/18/2023    8:26 AM 03/13/2023    8:35 AM  CMP  Glucose 70 - 99 mg/dL 034  742  595   BUN 8 - 23 mg/dL 17  13  11    Creatinine 0.61 - 1.24 mg/dL 6.38  7.56  4.33   Sodium 135 - 145 mmol/L 141  143  144   Potassium 3.5 - 5.1 mmol/L 3.5  3.7  3.8   Chloride 98 - 111 mmol/L 107  106  105   CO2 22 - 32 mmol/L 25  23  25    Calcium  8.9 - 10.3 mg/dL 9.2  9.6  9.6   Total Protein 6.5 - 8.1 g/dL 7.4  6.9  6.9   Total Bilirubin 0.0 - 1.2 mg/dL 1.0  0.8  1.1   Alkaline Phos 38 - 126 U/L 69  76  79   AST 15 - 41 U/L 16  29  16    ALT 0 - 44 U/L 15  31  12       RADIOGRAPHIC STUDIES: I have personally reviewed the radiological images as listed and agreed with the findings in the report. No results found.

## 2023-07-17 NOTE — Assessment & Plan Note (Addendum)
 Prostate cancer status post definitive radiation.   PSA slowly trends up due to biochemical recurrence,  Previous PSMA PET scan showed nonspecific prostate activity.  No definitive radiographic evidence of recurrence.  Continue observation.

## 2023-08-17 ENCOUNTER — Other Ambulatory Visit: Payer: Self-pay | Admitting: Internal Medicine

## 2023-08-17 DIAGNOSIS — I10 Essential (primary) hypertension: Secondary | ICD-10-CM

## 2023-09-17 ENCOUNTER — Other Ambulatory Visit

## 2023-09-17 DIAGNOSIS — E782 Mixed hyperlipidemia: Secondary | ICD-10-CM

## 2023-09-17 DIAGNOSIS — E119 Type 2 diabetes mellitus without complications: Secondary | ICD-10-CM

## 2023-09-17 DIAGNOSIS — I10 Essential (primary) hypertension: Secondary | ICD-10-CM

## 2023-09-19 ENCOUNTER — Ambulatory Visit (INDEPENDENT_AMBULATORY_CARE_PROVIDER_SITE_OTHER): Admitting: Internal Medicine

## 2023-09-19 ENCOUNTER — Encounter: Payer: Self-pay | Admitting: Internal Medicine

## 2023-09-19 VITALS — BP 117/82 | HR 79 | Temp 97.8°F | Ht 70.0 in | Wt 218.2 lb

## 2023-09-19 DIAGNOSIS — E119 Type 2 diabetes mellitus without complications: Secondary | ICD-10-CM

## 2023-09-19 DIAGNOSIS — Z0001 Encounter for general adult medical examination with abnormal findings: Secondary | ICD-10-CM

## 2023-09-19 DIAGNOSIS — E782 Mixed hyperlipidemia: Secondary | ICD-10-CM | POA: Diagnosis not present

## 2023-09-19 DIAGNOSIS — Z1331 Encounter for screening for depression: Secondary | ICD-10-CM

## 2023-09-19 DIAGNOSIS — I10 Essential (primary) hypertension: Secondary | ICD-10-CM

## 2023-09-19 DIAGNOSIS — Z0000 Encounter for general adult medical examination without abnormal findings: Secondary | ICD-10-CM

## 2023-09-19 LAB — POCT CBG (FASTING - GLUCOSE)-MANUAL ENTRY: Glucose Fasting, POC: 126 mg/dL — AB (ref 70–99)

## 2023-09-19 MED ORDER — METFORMIN HCL ER 500 MG PO TB24: 2000.0000 mg | ORAL_TABLET | Freq: Every day | ORAL | 1 refills | Status: DC

## 2023-09-19 NOTE — Progress Notes (Signed)
 Established Patient Office Visit  Subjective:  Patient ID: Jose Randolph, male    DOB: 10/07/1950  Age: 73 y.o. MRN: [id number]  Chief Complaint  Patient presents with   Annual Exam    Lab results    No new complaints, here for AWV refer to quality metrics and scanned documents.   Aslo here for lab review and medication refills. Labs reviewed and notable for well controlled diabetes, A1c remains at target, lipids and cmp pending. Denies any hypoglycemic episodes and home bg readings have been at target.     No other concerns at this time.   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 Excised, margin involved.    Past Surgical History:  Procedure Laterality Date   COLONOSCOPY WITH PROPOFOL  N/A 10/28/2019   Procedure: COLONOSCOPY WITH PROPOFOL ;  Surgeon: Maryruth Ole DASEN, MD;  Location: [facility] ENDOSCOPY;  Service: Endoscopy;  Laterality: N/A;   FRACTURE SURGERY      Social History   Socioeconomic History   Marital status: Married    Spouse name: Not on file   Number of children: Not on file   Years of education: Not on file   Highest education level: Not on file  Occupational History   Not on file  Tobacco Use   Smoking status: Former    Current packs/day: 0.00    Average packs/day: 0.5 packs/day for 5.0 years (2.5 ttl pk-yrs)    Types: Cigarettes    Start date: 03/06/1978    Quit date: 03/07/1983    Years since quitting: 40.5   Smokeless tobacco: Never  Vaping Use   Vaping status: Never Used  Substance and Sexual Activity   Alcohol use: Never   Drug use: Never   Sexual activity: Yes    Birth control/protection: None  Other Topics Concern   Not on file  Social History Narrative   Not on file   Social Drivers of Health   Financial Resource Strain: Not on file  Food Insecurity: Not on file   Transportation Needs: Not on file  Physical Activity: Not on file  Stress: Not on file  Social Connections: Not on file  Intimate Partner Violence: Not on file    Family History  Problem Relation Age of Onset   Colon cancer Father        dx 53s   Prostate cancer Brother 63   Prostate cancer Brother 74   Kidney cancer Brother 59   Cancer Maternal Aunt        unk type   Lung cancer Maternal Uncle    Leukemia Grandson 2    Allergies  Allergen Reactions   Oxycodone    Bee Pollen Itching    Outpatient Medications Prior to Visit  Medication Sig   atorvastatin  (LIPITOR) 40 MG tablet Take 1 tablet (40 mg total) by mouth daily.   carvedilol  (COREG  CR) 20 MG 24 hr capsule TAKE 1 CAPSULE BY MOUTH EVERY DAY IN THE MORNING   Cholecalciferol 25 MCG (1000 UT) tablet Take by mouth.   halobetasol (ULTRAVATE) 0.05 % ointment Apply topically.   Lancets (ONETOUCH ULTRASOFT) lancets USE WITH DEVICE TO CHECK SUGARS TWICE DAILY DX E11.9   Mechlorethamine HCl (VALCHLOR) 0.016 % GEL Apply topically.   Olmesartan -amLODIPine -HCTZ 40-10-12.5 MG TABS TAKE 1 TABLET BY MOUTH EVERY DAY IN THE MORNING  ONETOUCH ULTRA test strip 2 (two) times daily.   tadalafil (CIALIS) 20 MG tablet EVERY 36 HOURS AS NEEDED FOR ED.   tamsulosin  (FLOMAX ) 0.4 MG CAPS capsule TAKE 1 CAPSULE BY MOUTH 2 TIMES DAILY.   [DISCONTINUED] metFORMIN  (GLUCOPHAGE -XR) 500 MG 24 hr tablet Take 4 tablets (2,000 mg total) by mouth daily with breakfast. TAKE 4 TABLETS BY MOUTH DAILY WITH BREAKFAST   No facility-administered medications prior to visit.    Review of Systems  Constitutional:  Positive for weight loss (lost 1 lb). Negative for malaise/fatigue.  HENT: Negative.    Eyes: Negative.   Respiratory: Negative.    Cardiovascular: Negative.   Gastrointestinal: Negative.   Genitourinary: Negative.   Musculoskeletal: Negative.   Skin: Negative.   Neurological: Negative.   Endo/Heme/Allergies: Negative.    Psychiatric/Behavioral: Negative.         Objective:   BP 117/82   Pulse 79   Temp 97.8 F (36.6 C)   Ht 5' 10 (1.778 m)   Wt 218 lb 3.2 oz (99 kg)   SpO2 96%   BMI 31.31 kg/m   Vitals:   09/19/23 0817  BP: 117/82  Pulse: 79  Temp: 97.8 F (36.6 C)  Height: 5' 10 (1.778 m)  Weight: 218 lb 3.2 oz (99 kg)  SpO2: 96%  BMI (Calculated): 31.31    Physical Exam Vitals reviewed.  Constitutional:      Appearance: Normal appearance.  HENT:     Head: Normocephalic.     Left Ear: There is no impacted cerumen.     Nose: Nose normal.     Mouth/Throat:     Mouth: Mucous membranes are moist.     Pharynx: No posterior oropharyngeal erythema.  Eyes:     Extraocular Movements: Extraocular movements intact.     Pupils: Pupils are equal, round, and reactive to light.  Cardiovascular:     Rate and Rhythm: Regular rhythm.     Chest Wall: PMI is not displaced.     Pulses: Normal pulses.     Heart sounds: Normal heart sounds. No murmur heard. Pulmonary:     Effort: Pulmonary effort is normal.     Breath sounds: Normal air entry. No rhonchi or rales.  Abdominal:     General: Abdomen is flat. Bowel sounds are normal. There is no distension.     Palpations: Abdomen is soft. There is no hepatomegaly, splenomegaly or mass.     Tenderness: There is no abdominal tenderness.  Musculoskeletal:        General: Normal range of motion.     Cervical back: Normal range of motion and neck supple.     Right lower leg: No edema.     Left lower leg: No edema.  Skin:    General: Skin is warm and dry.  Neurological:     General: No focal deficit present.     Mental Status: He is alert and oriented to person, place, and time.     Cranial Nerves: No cranial nerve deficit.     Motor: No weakness.  Psychiatric:        Mood and Affect: Mood normal.        Behavior: Behavior normal.      Results for orders placed or performed in visit on 09/19/23  POCT CBG (Fasting - Glucose)  Result  Value Ref Range   Glucose Fasting, POC 126 (A) 70 - 99 mg/dL    Recent Results (from the past 2160 hours)  PSA  Status: None   Collection Time: 07/12/23  8:03 AM  Result Value Ref Range   Prostatic Specific Antigen 1.52 0.00 - 4.00 ng/mL    Comment: (NOTE) While PSA levels of <=4.00 ng/ml are reported as reference range, some men with levels below 4.00 ng/ml can have prostate cancer and many men with PSA above 4.00 ng/ml do not have prostate cancer.  Other tests such as free PSA, age specific reference ranges, PSA velocity and PSA doubling time may be helpful especially in men less than 80 years old. Performed at [facility], [address], [place], [place] [address]   CBC with Differential (Cancer Center Only)     Status: None   Collection Time: 07/12/23  8:03 AM  Result Value Ref Range   WBC Count 5.5 4.0 - 10.5 K/uL   RBC 4.57 4.22 - 5.81 MIL/uL   Hemoglobin 13.9 13.0 - 17.0 g/dL   HCT 58.9 60.9 - 47.9 %   MCV 89.7 80.0 - 100.0 fL   MCH 30.4 26.0 - 34.0 pg   MCHC 33.9 30.0 - 36.0 g/dL   RDW 86.5 88.4 - 84.4 %   Platelet Count 203 150 - 400 K/uL   nRBC 0.0 0.0 - 0.2 %   Neutrophils Relative % 51 %   Neutro Abs 2.8 1.7 - 7.7 K/uL   Lymphocytes Relative 31 %   Lymphs Abs 1.7 0.7 - 4.0 K/uL   Monocytes Relative 12 %   Monocytes Absolute 0.7 0.1 - 1.0 K/uL   Eosinophils Relative 4 %   Eosinophils Absolute 0.2 0.0 - 0.5 K/uL   Basophils Relative 1 %   Basophils Absolute 0.1 0.0 - 0.1 K/uL   Immature Granulocytes 1 %   Abs Immature Granulocytes 0.03 0.00 - 0.07 K/uL    Comment: Performed at [facility], [address] [place], [place] [address]  CMP (Cancer Center only)     Status: Abnormal   Collection Time: 07/12/23  8:03 AM  Result Value Ref Range   Sodium 141 135 - 145 mmol/L   Potassium 3.5 3.5 - 5.1 mmol/L   Chloride 107 98 - 111 mmol/L   CO2 25 22 - 32 mmol/L   Glucose, Bld 137 (H) 70 - 99 mg/dL    Comment: Glucose reference range applies  only to samples taken after fasting for at least 8 hours.   BUN 17 8 - 23 mg/dL   Creatinine 9.19 9.38 - 1.24 mg/dL   Calcium  9.2 8.9 - 10.3 mg/dL   Total Protein 7.4 6.5 - 8.1 g/dL   Albumin 4.0 3.5 - 5.0 g/dL   AST 16 15 - 41 U/L   ALT 15 0 - 44 U/L   Alkaline Phosphatase 69 38 - 126 U/L   Total Bilirubin 1.0 0.0 - 1.2 mg/dL   GFR, Estimated >39 >39 mL/min    Comment: (NOTE) Calculated using the CKD-EPI Creatinine Equation (2021)    Anion gap 9 5 - 15    Comment: Performed at [facility], [address] [place], [place] [address]  POCT CBG (Fasting - Glucose)     Status: Abnormal   Collection Time: 09/19/23  8:31 AM  Result Value Ref Range   Glucose Fasting, POC 126 (A) 70 - 99 mg/dL      Assessment & Plan:  As per problem list. Problem List Items Addressed This Visit       Endocrine   Type 2 diabetes mellitus without complication, without long-term  current use of insulin (HCC) - Primary   Relevant Medications   metFORMIN  (GLUCOPHAGE -XR) 500 MG 24 hr tablet   Other Relevant Orders   POCT CBG (Fasting - Glucose) (Completed)   Hemoglobin A1c     Other   Mixed hyperlipidemia   Relevant Orders   Lipid panel   Lipid panel   CK   Hepatic function panel   Other Visit Diagnoses       Annual wellness visit         Essential (primary) hypertension           Return in 3 months (on 12/20/2023) for fu with labs prior.   Total time spent: 40 minutes  Sherrill Cinderella Perry, MD  09/19/2023   This document may have been prepared by [facility] Voice Recognition software and as such may include unintentional dictation errors.

## 2023-09-22 LAB — LIPID PANEL
Chol/HDL Ratio: 2.5 ratio (ref 0.0–5.0)
Cholesterol, Total: 123 mg/dL (ref 100–199)
HDL: 50 mg/dL (ref >39)
LDL Chol Calc (NIH): 62 mg/dL (ref 0–99)
Triglycerides: 46 mg/dL (ref 0–149)
VLDL Cholesterol Cal: 11 mg/dL (ref 5–40)

## 2023-09-22 LAB — CMP14+EGFR
ALT: 19 IU/L (ref 0–44)
AST: 19 IU/L (ref 0–40)
Albumin: 4.3 g/dL (ref 3.9–4.9)
Alkaline Phosphatase: 89 IU/L (ref 44–121)
BUN/Creatinine Ratio: 21 (ref 10–24)
BUN: 18 mg/dL (ref 8–27)
Bilirubin Total: 0.3 mg/dL (ref 0.0–1.2)
CO2: 18 mmol/L — ABNORMAL LOW (ref 20–29)
Calcium: 9.8 mg/dL (ref 8.6–10.2)
Chloride: 105 mmol/L (ref 96–106)
Creatinine, Ser: 0.85 mg/dL (ref 0.76–1.27)
Globulin, Total: 2.7 g/dL (ref 1.5–4.5)
Glucose: 113 mg/dL — ABNORMAL HIGH (ref 70–99)
Potassium: 4 mmol/L (ref 3.5–5.2)
Sodium: 143 mmol/L (ref 134–144)
Total Protein: 7 g/dL (ref 6.0–8.5)
eGFR: 93 mL/min/1.73 (ref >59)

## 2023-09-22 LAB — CK: Total CK: 118 U/L (ref 41–331)

## 2023-09-22 LAB — HEPATIC FUNCTION PANEL: Bilirubin, Direct: 0.16 mg/dL (ref 0.00–0.40)

## 2023-09-22 LAB — HEMOGLOBIN A1C
Est. average glucose Bld gHb Est-mCnc: 143 mg/dL
Hgb A1c MFr Bld: 6.6 % — ABNORMAL HIGH (ref 4.8–5.6)

## 2023-09-27 ENCOUNTER — Encounter: Payer: Self-pay | Admitting: *Deleted

## 2023-10-08 ENCOUNTER — Ambulatory Visit: Admitting: Certified Registered"

## 2023-10-08 ENCOUNTER — Ambulatory Visit
Admission: RE | Admit: 2023-10-08 | Discharge: 2023-10-08 | Disposition: A | Discharge status: Home / Self Care | Attending: Gastroenterology | Admitting: Gastroenterology

## 2023-10-08 ENCOUNTER — Encounter
Admission: RE | Disposition: A | Discharge status: Home / Self Care | Payer: Self-pay | Source: Home / Self Care | Attending: Gastroenterology

## 2023-10-08 DIAGNOSIS — Z800 Family history of malignant neoplasm of digestive organs: Secondary | ICD-10-CM | POA: Diagnosis not present

## 2023-10-08 DIAGNOSIS — I10 Essential (primary) hypertension: Secondary | ICD-10-CM | POA: Diagnosis not present

## 2023-10-08 DIAGNOSIS — Z860101 Personal history of adenomatous and serrated colon polyps: Secondary | ICD-10-CM | POA: Diagnosis not present

## 2023-10-08 DIAGNOSIS — D175 Benign lipomatous neoplasm of intra-abdominal organs: Secondary | ICD-10-CM | POA: Insufficient documentation

## 2023-10-08 DIAGNOSIS — Z79899 Other long term (current) drug therapy: Secondary | ICD-10-CM | POA: Diagnosis not present

## 2023-10-08 DIAGNOSIS — Z1211 Encounter for screening for malignant neoplasm of colon: Secondary | ICD-10-CM | POA: Insufficient documentation

## 2023-10-08 DIAGNOSIS — E66813 Obesity, class 3: Secondary | ICD-10-CM | POA: Insufficient documentation

## 2023-10-08 DIAGNOSIS — K641 Second degree hemorrhoids: Secondary | ICD-10-CM | POA: Insufficient documentation

## 2023-10-08 DIAGNOSIS — Z7984 Long term (current) use of oral hypoglycemic drugs: Secondary | ICD-10-CM | POA: Diagnosis not present

## 2023-10-08 DIAGNOSIS — Z87891 Personal history of nicotine dependence: Secondary | ICD-10-CM | POA: Insufficient documentation

## 2023-10-08 DIAGNOSIS — Z6830 Body mass index (BMI) 30.0-30.9, adult: Secondary | ICD-10-CM | POA: Insufficient documentation

## 2023-10-08 DIAGNOSIS — E119 Type 2 diabetes mellitus without complications: Secondary | ICD-10-CM | POA: Diagnosis not present

## 2023-10-08 DIAGNOSIS — K5730 Diverticulosis of large intestine without perforation or abscess without bleeding: Secondary | ICD-10-CM | POA: Diagnosis not present

## 2023-10-08 HISTORY — PX: COLONOSCOPY: SHX5424

## 2023-10-08 HISTORY — DX: Mycosis fungoides, unspecified site: C84.00

## 2023-10-08 HISTORY — DX: Unspecified malignant neoplasm of skin, unspecified: C44.90

## 2023-10-08 HISTORY — DX: Latent tuberculosis: Z22.7

## 2023-10-08 LAB — GLUCOSE, CAPILLARY: Glucose-Capillary: 135 mg/dL — ABNORMAL HIGH (ref 70–99)

## 2023-10-08 SURGERY — COLONOSCOPY
Anesthesia: General

## 2023-10-08 MED ORDER — SODIUM CHLORIDE 0.9 % IV SOLN
INTRAVENOUS | Status: DC
  Administered 2023-10-08: 500 mL via INTRAVENOUS

## 2023-10-08 MED ORDER — PROPOFOL 500 MG/50ML IV EMUL
INTRAVENOUS | Status: DC | PRN
  Administered 2023-10-08: 150 ug/kg/min via INTRAVENOUS
  Administered 2023-10-08: 50 mg via INTRAVENOUS

## 2023-10-08 MED ORDER — LIDOCAINE HCL (CARDIAC) PF 100 MG/5ML IV SOSY
PREFILLED_SYRINGE | INTRAVENOUS | Status: DC | PRN
  Administered 2023-10-08: 100 mg via INTRAVENOUS

## 2023-10-08 NOTE — Interval H&P Note (Signed)
 History and Physical Interval Note:  10/08/2023 9:16 AM  Jose Randolph  has presented today for surgery, with the diagnosis of H/O Colon Polyps.  The various methods of treatment have been discussed with the patient and family. After consideration of risks, benefits and other options for treatment, the patient has consented to  Procedure(s) with comments: COLONOSCOPY (N/A) - DM as a surgical intervention.  The patient's history has been reviewed, patient examined, no change in status, stable for surgery.  I have reviewed the patient's chart and labs.  Questions were answered to the patient's satisfaction.     [name]  Ok to proceed with colonoscopy

## 2023-10-08 NOTE — Anesthesia Postprocedure Evaluation (Signed)
 Anesthesia Post Note  Patient: Jose Randolph  Procedure(s) Performed: COLONOSCOPY  Patient location during evaluation: PACU Anesthesia Type: General Level of consciousness: awake and alert Pain management: satisfactory to patient Vital Signs Assessment: post-procedure vital signs reviewed and stable Respiratory status: spontaneous breathing and nonlabored ventilation Cardiovascular status: blood pressure returned to baseline Anesthetic complications: no   There were no known notable events for this encounter.   Last Vitals:  Vitals:   10/08/23 1001 10/08/23 1006  BP: 113/66 111/64  Pulse:  66  Resp:  (!) 22  Temp:    SpO2:  100%    Last Pain:  Vitals:   10/08/23 1001  TempSrc:   PainSc: 0-No pain                 VAN STAVEREN,Noely Kuhnle

## 2023-10-08 NOTE — Transfer of Care (Signed)
 Immediate Anesthesia Transfer of Care Note  Patient: Jose Randolph  Procedure(s) Performed: COLONOSCOPY  Patient Location: PACU  Anesthesia Type:General  Level of Consciousness: drowsy and patient cooperative  Airway & Oxygen Therapy: Patient Spontanous Breathing and Patient connected to nasal cannula oxygen  Post-op Assessment: Report given to RN and Post -op Vital signs reviewed and stable  Post vital signs: stable on 3L oxygen, Left lateral side, bp 90s/60s  Last Vitals:  Vitals Value Taken Time  BP 93/63 10/08/23 09:46  Temp    Pulse 69 10/08/23 09:45  Resp 22 10/08/23 09:47  SpO2 99 % 10/08/23 09:45  Vitals shown include unfiled device data.  Last Pain:  Vitals:   10/08/23 0945  TempSrc:   PainSc: 0-No pain         Complications: No notable events documented.

## 2023-10-08 NOTE — Anesthesia Preprocedure Evaluation (Signed)
 Anesthesia Evaluation  Patient identified by MRN, date of birth, ID band Patient awake    Reviewed: Allergy & Precautions, NPO status , Patient's Chart, lab work & pertinent test results  Airway Mallampati: II  TM Distance: >3 FB Neck ROM: Full    Dental  (+) Teeth Intact   Pulmonary neg pulmonary ROS, Patient abstained from smoking., former smoker   breath sounds clear to auscultation       Cardiovascular Exercise Tolerance: Good hypertension, Pt. on medications  Rhythm:Regular Rate:Normal     Neuro/Psych negative neurological ROS  negative psych ROS   GI/Hepatic negative GI ROS, Neg liver ROS,,,  Endo/Other  diabetes, Type 2, Oral Hypoglycemic Agents  Class 3 obesity  Renal/GU negative Renal ROS  negative genitourinary   Musculoskeletal   Abdominal Normal abdominal exam  (+)   Peds negative pediatric ROS (+)  Hematology   Anesthesia Other Findings   Reproductive/Obstetrics                              Anesthesia Physical Anesthesia Plan  ASA: 2  Anesthesia Plan: General   Post-op Pain Management:    Induction: Intravenous  PONV Risk Score and Plan:   Airway Management Planned: Natural Airway and Nasal Cannula  Additional Equipment:   Intra-op Plan:   Post-operative Plan:   Informed Consent: I have reviewed the patients History and Physical, chart, labs and discussed the procedure including the risks, benefits and alternatives for the proposed anesthesia with the patient or authorized representative who has indicated his/her understanding and acceptance.     Dental Advisory Given  Plan Discussed with: Anesthesiologist, CRNA and Surgeon  Anesthesia Plan Comments: (Patient consented for risks of anesthesia including but not limited to:  - adverse reactions to medications - risk of airway placement if required - damage to eyes, teeth, lips or other oral mucosa - nerve  damage due to positioning  - sore throat or hoarseness - Damage to heart, brain, nerves, lungs, other parts of body or loss of life  Patient voiced understanding and assent.)        Anesthesia Quick Evaluation

## 2023-10-08 NOTE — Op Note (Signed)
 [facility] Patient Name: Jose Randolph Procedure Date: 10/08/2023 9:00 AM MRN: [id number] Account #: [id number] Date of Birth: 09-03-1950 Admit Type: Outpatient Age: 73 Room: [facility] ENDO ROOM 3 Gender: Male Note Status: Finalized Instrument Name: Arvis [id number] Procedure:             Colonoscopy Indications:           Surveillance: Personal history of adenomatous polyps                         on last colonoscopy > 3 years ago Providers:             Ole Schick MD, MD Referring MD:          Sherrill LABOR. Albina, MD (Referring MD) Medicines:             Monitored Anesthesia Care Complications:         No immediate complications. Procedure:             Pre-Anesthesia Assessment:                        - Prior to the procedure, a History and Physical was                         performed, and patient medications and allergies were                         reviewed. The patient is competent. The risks and                         benefits of the procedure and the sedation options and                         risks were discussed with the patient. All questions                         were answered and informed consent was obtained.                         Patient identification and proposed procedure were                         verified by the physician, the nurse, the                         anesthesiologist, the anesthetist and the technician                         in the endoscopy suite. Mental Status Examination:                         alert and oriented. Airway Examination: normal                         oropharyngeal airway and neck mobility. Respiratory                         Examination: clear to auscultation. CV Examination:  normal. Prophylactic Antibiotics: The patient does not                         require prophylactic antibiotics. Prior                         Anticoagulants: The patient has taken no  anticoagulant                         or antiplatelet agents. ASA Grade Assessment: II - A                         patient with mild systemic disease. After reviewing                         the risks and benefits, the patient was deemed in                         satisfactory condition to undergo the procedure. The                         anesthesia plan was to use monitored anesthesia care                         (MAC). Immediately prior to administration of                         medications, the patient was re-assessed for adequacy                         to receive sedatives. The heart rate, respiratory                         rate, oxygen saturations, blood pressure, adequacy of                         pulmonary ventilation, and response to care were                         monitored throughout the procedure. The physical                         status of the patient was re-assessed after the                         procedure.                        After obtaining informed consent, the colonoscope was                         passed under direct vision. Throughout the procedure,                         the patient's blood pressure, pulse, and oxygen                         saturations were monitored continuously. The  Colonoscope was introduced through the anus and                         advanced to the the cecum, identified by appendiceal                         orifice and ileocecal valve. The colonoscopy was                         performed without difficulty. The patient tolerated                         the procedure well. The quality of the bowel                         preparation was adequate to identify polyps. The                         ileocecal valve, appendiceal orifice, and rectum were                         photographed. Findings:      The perianal and digital rectal examinations were normal.      There was a medium-sized lipoma, in the  cecum.      A few small-mouthed diverticula were found in the sigmoid colon.      Internal hemorrhoids were found during retroflexion. The hemorrhoids       were Grade II (internal hemorrhoids that prolapse but reduce       spontaneously).      The exam was otherwise without abnormality on direct and retroflexion       views. 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                        - Diverticulosis in the sigmoid colon.                        - Internal hemorrhoids.                        - The examination was otherwise normal on direct and                         retroflexion views.                        - No specimens collected. Recommendation:        - Discharge patient to home.                        - Resume previous diet.                        - Continue present medications.                        - Repeat colonoscopy in 5 years for surveillance.                        - Return  to referring physician as previously                         scheduled. Procedure Code(s):     --- Professional ---                        H9894, Colorectal cancer screening; colonoscopy on                         individual at high risk Diagnosis Code(s):     --- Professional ---                        Z86.010, Personal history of colonic polyps                        D17.5, Benign lipomatous neoplasm of intra-abdominal                         organs                        K64.1, Second degree hemorrhoids                        K57.30, Diverticulosis of large intestine without                         perforation or abscess without bleeding CPT copyright 2022 American Medical Association. All rights reserved. The codes documented in this report are preliminary and upon coder review may  be revised to meet current compliance requirements. Ole Schick MD, MD 10/08/2023 9:52:13 AM Number of Addenda: 0 Note Initiated On: 10/08/2023 9:00 AM Scope Withdrawal Time: 0 hours 8  minutes 52 seconds  Total Procedure Duration: 0 hours 13 minutes 42 seconds  Estimated Blood Loss:  Estimated blood loss: none.      [facility]

## 2023-10-08 NOTE — H&P (Signed)
 Outpatient short stay form Pre-procedure 10/08/2023  Jose ONEIDA Schick, MD  Primary Physician: Albina GORMAN Dine, MD  Reason for visit:  Surveillance  History of present illness:    73 y/o gentleman with history of HLD, hypertension, and DM II here for colonoscopy for history of multiple Ta's on last colonoscopy in 2021. No blood thinners. Father with colon cancer in his 34's. No significant abdominal surgeries.    Current Facility-Administered Medications:    0.9 %  sodium chloride  infusion, , Intravenous, Continuous, Coron Rossano, Jose ONEIDA, MD, Last Rate: 20 mL/hr at 10/08/23 0908, 500 mL at 10/08/23 0908  Medications Prior to Admission  Medication Sig Dispense Refill Last Dose/Taking   atorvastatin  (LIPITOR) 40 MG tablet Take 1 tablet (40 mg total) by mouth daily. 90 tablet 1 Past Week   carvedilol  (COREG  CR) 20 MG 24 hr capsule TAKE 1 CAPSULE BY MOUTH EVERY DAY IN THE MORNING 90 capsule 1 Past Week   Cholecalciferol 25 MCG (1000 UT) tablet Take by mouth.   Past Week   halobetasol (ULTRAVATE) 0.05 % ointment Apply topically.   Past Week   Lancets (ONETOUCH ULTRASOFT) lancets USE WITH DEVICE TO CHECK SUGARS TWICE DAILY DX E11.9   Past Week   Mechlorethamine HCl (VALCHLOR) 0.016 % GEL Apply topically.   Past Week   metFORMIN  (GLUCOPHAGE -XR) 500 MG 24 hr tablet Take 4 tablets (2,000 mg total) by mouth daily with breakfast. TAKE 4 TABLETS BY MOUTH DAILY WITH BREAKFAST 360 tablet 1 Past Week   Olmesartan -amLODIPine -HCTZ 40-10-12.5 MG TABS TAKE 1 TABLET BY MOUTH EVERY DAY IN THE MORNING 90 tablet 0 Past Week   ONETOUCH ULTRA test strip 2 (two) times daily.   Past Week   tamsulosin  (FLOMAX ) 0.4 MG CAPS capsule TAKE 1 CAPSULE BY MOUTH 2 TIMES DAILY. 180 capsule 3 Past Week   tadalafil (CIALIS) 20 MG tablet EVERY 36 HOURS AS NEEDED FOR ED.        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 Excised, margin involved.    Review of systems:  Otherwise negative.    Physical Exam  Gen: Alert, oriented. Appears stated age.  HEENT: PERRLA. Lungs: No respiratory distress CV: RRR Abd: soft, benign, no masses Ext: No edema    Planned procedures: Proceed with colonoscopy. The patient understands the nature of the planned procedure, indications, risks, alternatives and potential complications including but not limited to bleeding, infection, perforation, damage to internal organs and possible oversedation/side effects from anesthesia. The patient agrees and gives consent to proceed.  Please refer to procedure notes for findings, recommendations and patient disposition/instructions.     Jose ONEIDA Schick, MD [facility]

## 2023-10-09 ENCOUNTER — Encounter: Payer: Self-pay | Admitting: Gastroenterology

## 2023-10-19 ENCOUNTER — Other Ambulatory Visit: Payer: Self-pay | Admitting: Internal Medicine

## 2023-10-19 ENCOUNTER — Other Ambulatory Visit: Payer: Self-pay | Admitting: Urology

## 2023-10-19 DIAGNOSIS — E119 Type 2 diabetes mellitus without complications: Secondary | ICD-10-CM

## 2023-11-15 ENCOUNTER — Other Ambulatory Visit: Payer: Self-pay | Admitting: Internal Medicine

## 2023-11-15 DIAGNOSIS — I10 Essential (primary) hypertension: Secondary | ICD-10-CM

## 2023-11-18 ENCOUNTER — Other Ambulatory Visit: Payer: Self-pay | Admitting: Internal Medicine

## 2023-11-18 DIAGNOSIS — E119 Type 2 diabetes mellitus without complications: Secondary | ICD-10-CM

## 2023-12-18 ENCOUNTER — Other Ambulatory Visit

## 2023-12-18 DIAGNOSIS — E119 Type 2 diabetes mellitus without complications: Secondary | ICD-10-CM

## 2023-12-18 DIAGNOSIS — E782 Mixed hyperlipidemia: Secondary | ICD-10-CM

## 2023-12-19 LAB — HEPATIC FUNCTION PANEL
ALT: 29 IU/L (ref 0–44)
AST: 26 IU/L (ref 0–40)
Albumin: 3.9 g/dL (ref 3.8–4.8)
Alkaline Phosphatase: 77 IU/L (ref 47–123)
Bilirubin Total: 0.9 mg/dL (ref 0.0–1.2)
Bilirubin, Direct: 0.24 mg/dL (ref 0.00–0.40)
Total Protein: 6.4 g/dL (ref 6.0–8.5)

## 2023-12-19 LAB — LIPID PANEL
Chol/HDL Ratio: 2.2 ratio (ref 0.0–5.0)
Cholesterol, Total: 114 mg/dL (ref 100–199)
HDL: 53 mg/dL (ref >39)
LDL Chol Calc (NIH): 51 mg/dL (ref 0–99)
Triglycerides: 38 mg/dL (ref 0–149)
VLDL Cholesterol Cal: 10 mg/dL (ref 5–40)

## 2023-12-19 LAB — CK: Total CK: 122 U/L (ref 41–331)

## 2023-12-19 LAB — HEMOGLOBIN A1C
Est. average glucose Bld gHb Est-mCnc: 143 mg/dL
Hgb A1c MFr Bld: 6.6 % — ABNORMAL HIGH (ref 4.8–5.6)

## 2023-12-24 ENCOUNTER — Ambulatory Visit: Payer: Self-pay | Admitting: Internal Medicine

## 2023-12-24 ENCOUNTER — Ambulatory Visit (INDEPENDENT_AMBULATORY_CARE_PROVIDER_SITE_OTHER): Admitting: Internal Medicine

## 2023-12-24 VITALS — BP 122/58 | HR 79 | Temp 96.6°F | Ht 70.0 in | Wt 219.0 lb

## 2023-12-24 DIAGNOSIS — E1165 Type 2 diabetes mellitus with hyperglycemia: Secondary | ICD-10-CM

## 2023-12-24 DIAGNOSIS — Z0130 Encounter for examination of blood pressure without abnormal findings: Secondary | ICD-10-CM

## 2023-12-24 DIAGNOSIS — Z23 Encounter for immunization: Secondary | ICD-10-CM | POA: Diagnosis not present

## 2023-12-24 DIAGNOSIS — M1712 Unilateral primary osteoarthritis, left knee: Secondary | ICD-10-CM

## 2023-12-24 LAB — POC CREATINE & ALBUMIN,URINE
Albumin/Creatinine Ratio, Urine, POC: <30
Creatinine, POC: 50 mg/dL
Microalbumin Ur, POC: 10 mg/L

## 2023-12-24 LAB — POCT CBG (FASTING - GLUCOSE)-MANUAL ENTRY: Glucose Fasting, POC: 137 mg/dL — AB (ref 70–99)

## 2023-12-24 MED ORDER — MELOXICAM 15 MG PO TABS: 15.0000 mg | ORAL_TABLET | Freq: Every day | ORAL | 0 refills | Status: DC

## 2023-12-24 MED ORDER — DICLOFENAC SODIUM 1 % EX GEL: 4.0000 g | Freq: Two times a day (BID) | CUTANEOUS | 2 refills | Status: AC

## 2023-12-24 NOTE — Progress Notes (Signed)
 Established Patient Office Visit  Subjective:  Patient ID: Jose Randolph, male    DOB: [birth year]  Age: 73 y.o. MRN: [id number]  Chief Complaint  Patient presents with   Follow-up    3 months follow up    C/o several week history of moderate left knee pain and morning stiffness. Also here for lab review and medication refills.Labs reviewed and notable for well controlled diabetes, A1c at target, lipids at target with unremarkable cmp. Denies any hypoglycemic episodes and home bg readings have been at target.     No other concerns at this time.   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 Excised, margin involved.    Past Surgical History:  Procedure Laterality Date   COLONOSCOPY N/A 10/08/2023   Procedure: COLONOSCOPY;  Surgeon: Maryruth Ole DASEN, MD;  Location: [facility] ENDOSCOPY;  Service: Endoscopy;  Laterality: N/A;  DM   COLONOSCOPY WITH PROPOFOL  N/A 10/28/2019   Procedure: COLONOSCOPY WITH PROPOFOL ;  Surgeon: Maryruth Ole DASEN, MD;  Location: [facility] ENDOSCOPY;  Service: Endoscopy;  Laterality: N/A;   FRACTURE SURGERY      Social History   Socioeconomic History   Marital status: Married    Spouse name: Not on file   Number of children: Not on file   Years of education: Not on file   Highest education level: Not on file  Occupational History   Not on file  Tobacco Use   Smoking status: Former    Current packs/day: 0.00    Average packs/day: 0.5 packs/day for 5.0 years (2.5 ttl pk-yrs)    Types: Cigarettes    Start date: 03/06/1978    Quit date: 03/07/1983    Years since quitting: 40.8   Smokeless tobacco: Never  Vaping Use   Vaping status: Never Used  Substance and Sexual Activity   Alcohol use: Yes   Drug use: Never   Sexual activity:  Yes    Birth control/protection: None  Other Topics Concern   Not on file  Social History Narrative   Not on file   Social Drivers of Health   Financial Resource Strain: Not on file  Food Insecurity: Not on file  Transportation Needs: Not on file  Physical Activity: Not on file  Stress: Not on file  Social Connections: Not on file  Intimate Partner Violence: Not on file    Family History  Problem Relation Age of Onset   Colon cancer Father        dx 76s   Prostate cancer Brother 63   Prostate cancer Brother 48   Kidney cancer Brother 59   Cancer Maternal Aunt        unk type   Lung cancer Maternal Uncle    Leukemia Grandson 2    Allergies  Allergen Reactions   Oxycodone    Bee Pollen Itching    Outpatient Medications Prior to Visit  Medication Sig   atorvastatin  (LIPITOR) 40 MG tablet TAKE 1 TABLET BY MOUTH EVERY DAY   carvedilol  (COREG  CR) 20 MG 24 hr capsule TAKE 1 CAPSULE BY MOUTH EVERY DAY IN THE MORNING   Cholecalciferol 25 MCG (1000 UT) tablet Take by mouth.   FARXIGA  10 MG TABS tablet TAKE 1 TABLET BY MOUTH  EVERY DAY   halobetasol (ULTRAVATE) 0.05 % ointment Apply topically.   Lancets (ONETOUCH ULTRASOFT) lancets USE WITH DEVICE TO CHECK SUGARS TWICE DAILY DX E11.9   Mechlorethamine HCl (VALCHLOR) 0.016 % GEL Apply topically.   metFORMIN  (GLUCOPHAGE -XR) 500 MG 24 hr tablet Take 4 tablets (2,000 mg total) by mouth daily with breakfast. TAKE 4 TABLETS BY MOUTH DAILY WITH BREAKFAST   Olmesartan -amLODIPine -HCTZ 40-10-12.5 MG TABS TAKE 1 TABLET BY MOUTH EVERY DAY IN THE MORNING   ONETOUCH ULTRA test strip 2 (two) times daily.   tadalafil (CIALIS) 20 MG tablet EVERY 36 HOURS AS NEEDED FOR ED.   tamsulosin  (FLOMAX ) 0.4 MG CAPS capsule TAKE 1 CAPSULE BY MOUTH 2 TIMES DAILY.   No facility-administered medications prior to visit.    Review of Systems  Constitutional:  Negative for malaise/fatigue and weight loss (gained 1 lb).  HENT: Negative.    Eyes: Negative.    Respiratory: Negative.    Cardiovascular: Negative.   Gastrointestinal: Negative.   Genitourinary: Negative.   Musculoskeletal:  Positive for joint pain.  Skin: Negative.   Neurological: Negative.   Endo/Heme/Allergies: Negative.   Psychiatric/Behavioral: Negative.         Objective:   BP (!) 122/58   Pulse 79   Temp (!) 96.6 F (35.9 C) (Tympanic)   Ht 5' 10 (1.778 m)   Wt 219 lb (99.3 kg)   SpO2 96%   BMI 31.42 kg/m   Vitals:   12/24/23 1143  BP: (!) 122/58  Pulse: 79  Temp: (!) 96.6 F (35.9 C)  Height: 5' 10 (1.778 m)  Weight: 219 lb (99.3 kg)  SpO2: 96%  TempSrc: Tympanic  BMI (Calculated): 31.42    Physical Exam Vitals reviewed.  Constitutional:      Appearance: Normal appearance.  HENT:     Head: Normocephalic.     Left Ear: There is no impacted cerumen.     Nose: Nose normal.     Mouth/Throat:     Mouth: Mucous membranes are moist.     Pharynx: No posterior oropharyngeal erythema.  Eyes:     Extraocular Movements: Extraocular movements intact.     Pupils: Pupils are equal, round, and reactive to light.  Cardiovascular:     Rate and Rhythm: Regular rhythm.     Chest Wall: PMI is not displaced.     Pulses: Normal pulses.     Heart sounds: Normal heart sounds. No murmur heard. Pulmonary:     Effort: Pulmonary effort is normal.     Breath sounds: Normal air entry. No rhonchi or rales.  Abdominal:     General: Abdomen is flat. Bowel sounds are normal. There is no distension.     Palpations: Abdomen is soft. There is no hepatomegaly, splenomegaly or mass.     Tenderness: There is no abdominal tenderness.  Musculoskeletal:        General: Normal range of motion.     Cervical back: Normal range of motion and neck supple.     Right lower leg: No edema.     Left lower leg: No edema.  Skin:    General: Skin is warm and dry.  Neurological:     General: No focal deficit present.     Mental Status: He is alert and oriented to person, place, and  time.     Cranial Nerves: No cranial nerve deficit.     Motor: No weakness.  Psychiatric:        Mood and Affect: Mood normal.  Behavior: Behavior normal.      Results for orders placed or performed in visit on 12/24/23  POCT CBG (Fasting - Glucose)  Result Value Ref Range   Glucose Fasting, POC 137 (A) 70 - 99 mg/dL  POC CREATINE & ALBUMIN,URINE  Result Value Ref Range   Microalbumin Ur, POC 10 mg/L   Creatinine, POC 50 mg/dL   Albumin/Creatinine Ratio, Urine, POC <30     Recent Results (from the past 2160 hours)  Glucose, capillary     Status: Abnormal   Collection Time: 10/08/23  9:11 AM  Result Value Ref Range   Glucose-Capillary 135 (H) 70 - 99 mg/dL    Comment: Glucose reference range applies only to samples taken after fasting for at least 8 hours.  Hepatic function panel     Status: None   Collection Time: 12/18/23  8:22 AM  Result Value Ref Range   Total Protein 6.4 6.0 - 8.5 g/dL   Albumin 3.9 3.8 - 4.8 g/dL   Bilirubin Total 0.9 0.0 - 1.2 mg/dL   Bilirubin, Direct 9.75 0.00 - 0.40 mg/dL   Alkaline Phosphatase 77 47 - 123 IU/L   AST 26 0 - 40 IU/L   ALT 29 0 - 44 IU/L  CK     Status: None   Collection Time: 12/18/23  8:22 AM  Result Value Ref Range   Total CK 122 41 - 331 U/L  Lipid panel     Status: None   Collection Time: 12/18/23  8:22 AM  Result Value Ref Range   Cholesterol, Total 114 100 - 199 mg/dL   Triglycerides 38 0 - 149 mg/dL   HDL 53 >60 mg/dL   VLDL Cholesterol Cal 10 5 - 40 mg/dL   LDL Chol Calc (NIH) 51 0 - 99 mg/dL   Chol/HDL Ratio 2.2 0.0 - 5.0 ratio    Comment:                                   T. Chol/HDL Ratio                                             Men  Women                               1/2 Avg.Risk  3.4    3.3                                   Avg.Risk  5.0    4.4                                2X Avg.Risk  9.6    7.1                                3X Avg.Risk 23.4   11.0   Hemoglobin A1c     Status: Abnormal    Collection Time: 12/18/23  8:22 AM  Result Value Ref Range   Hgb A1c MFr Bld 6.6 (H) 4.8 - 5.6 %  Comment:          Prediabetes: 5.7 - 6.4          Diabetes: >6.4          Glycemic control for adults with diabetes: <7.0    Est. average glucose Bld gHb Est-mCnc 143 mg/dL  POCT CBG (Fasting - Glucose)     Status: Abnormal   Collection Time: 12/24/23 11:48 AM  Result Value Ref Range   Glucose Fasting, POC 137 (A) 70 - 99 mg/dL  POC CREATINE & ALBUMIN,URINE     Status: Normal   Collection Time: 12/24/23 12:08 PM  Result Value Ref Range   Microalbumin Ur, POC 10 mg/L   Creatinine, POC 50 mg/dL   Albumin/Creatinine Ratio, Urine, POC <30       Assessment & Plan:  Nature was seen today for follow-up.  Type 2 diabetes mellitus with hyperglycemia, unspecified whether long term insulin use (HCC) -     POCT CBG (Fasting - Glucose) -     POC CREATINE & ALBUMIN,URINE  Arthritis of left knee -     Diclofenac Sodium; Apply 4 g topically in the morning and at bedtime.  Dispense: 240 g; Refill: 2 -     Meloxicam; Take 1 tablet (15 mg total) by mouth daily.  Dispense: 30 tablet; Refill: 0  Needs flu shot -     Flu Vaccine Trivalent High Dose (Fluad)    Problem List Items Addressed This Visit   None Visit Diagnoses       Type 2 diabetes mellitus with hyperglycemia, unspecified whether long term insulin use (HCC)    -  Primary   Relevant Orders   POCT CBG (Fasting - Glucose) (Completed)   POC CREATINE & ALBUMIN,URINE (Completed)     Arthritis of left knee       Relevant Medications   diclofenac Sodium (VOLTAREN) 1 % GEL   meloxicam (MOBIC) 15 MG tablet     Needs flu shot       Relevant Orders   Flu Vaccine Trivalent High Dose (Fluad) (Completed)       Return in about 3 months (around 03/25/2024) for fu with labs prior.   Total time spent: 20 minutes. This time includes review of previous notes and results and patient face to face interaction during today'[place] visit.    Sherrill Cinderella Perry, MD  12/24/2023   This document may have been prepared by [facility] Voice Recognition software and as such may include unintentional dictation errors.

## 2023-12-26 ENCOUNTER — Ambulatory Visit: Admitting: Internal Medicine

## 2024-01-09 ENCOUNTER — Other Ambulatory Visit: Payer: Self-pay | Admitting: *Deleted

## 2024-01-09 DIAGNOSIS — C61 Malignant neoplasm of prostate: Secondary | ICD-10-CM

## 2024-01-17 ENCOUNTER — Inpatient Hospital Stay: Payer: Medicare Other | Attending: Oncology

## 2024-01-17 ENCOUNTER — Inpatient Hospital Stay

## 2024-01-17 ENCOUNTER — Telehealth: Payer: Self-pay

## 2024-01-17 ENCOUNTER — Other Ambulatory Visit: Payer: Self-pay

## 2024-01-17 ENCOUNTER — Inpatient Hospital Stay (HOSPITAL_BASED_OUTPATIENT_CLINIC_OR_DEPARTMENT_OTHER): Admitting: Hospice and Palliative Medicine

## 2024-01-17 ENCOUNTER — Other Ambulatory Visit: Payer: Self-pay | Admitting: Internal Medicine

## 2024-01-17 ENCOUNTER — Telehealth: Payer: Self-pay | Admitting: *Deleted

## 2024-01-17 ENCOUNTER — Encounter: Payer: Self-pay | Admitting: Hospice and Palliative Medicine

## 2024-01-17 VITALS — BP 111/57 | HR 75 | Resp 18

## 2024-01-17 VITALS — BP 109/66 | HR 64 | Temp 97.1°F | Resp 20 | Ht 70.0 in | Wt 219.0 lb

## 2024-01-17 DIAGNOSIS — R197 Diarrhea, unspecified: Secondary | ICD-10-CM | POA: Insufficient documentation

## 2024-01-17 DIAGNOSIS — C61 Malignant neoplasm of prostate: Secondary | ICD-10-CM

## 2024-01-17 DIAGNOSIS — Z87891 Personal history of nicotine dependence: Secondary | ICD-10-CM | POA: Insufficient documentation

## 2024-01-17 DIAGNOSIS — Z8572 Personal history of non-Hodgkin lymphomas: Secondary | ICD-10-CM | POA: Insufficient documentation

## 2024-01-17 DIAGNOSIS — E119 Type 2 diabetes mellitus without complications: Secondary | ICD-10-CM

## 2024-01-17 DIAGNOSIS — Z7984 Long term (current) use of oral hypoglycemic drugs: Secondary | ICD-10-CM | POA: Diagnosis not present

## 2024-01-17 DIAGNOSIS — E876 Hypokalemia: Secondary | ICD-10-CM

## 2024-01-17 LAB — CMP (CANCER CENTER ONLY)
ALT: 21 U/L (ref 0–44)
AST: 22 U/L (ref 15–41)
Albumin: 3.8 g/dL (ref 3.5–5.0)
Alkaline Phosphatase: 62 U/L (ref 38–126)
Anion gap: 11 (ref 5–15)
BUN: 11 mg/dL (ref 8–23)
CO2: 25 mmol/L (ref 22–32)
Calcium: 9.1 mg/dL (ref 8.9–10.3)
Chloride: 108 mmol/L (ref 98–111)
Creatinine: 0.73 mg/dL (ref 0.61–1.24)
GFR, Estimated: >60 mL/min (ref >60)
Glucose, Bld: 129 mg/dL — ABNORMAL HIGH (ref 70–99)
Potassium: 2.6 mmol/L — CL (ref 3.5–5.1)
Sodium: 144 mmol/L (ref 135–145)
Total Bilirubin: 1.7 mg/dL — ABNORMAL HIGH (ref 0.0–1.2)
Total Protein: 7.1 g/dL (ref 6.5–8.1)

## 2024-01-17 LAB — CBC WITH DIFFERENTIAL (CANCER CENTER ONLY)
Abs Immature Granulocytes: 0.03 K/uL (ref 0.00–0.07)
Basophils Absolute: 0.1 K/uL (ref 0.0–0.1)
Basophils Relative: 1 %
Eosinophils Absolute: 0.2 K/uL (ref 0.0–0.5)
Eosinophils Relative: 2 %
HCT: 37 % — ABNORMAL LOW (ref 39.0–52.0)
Hemoglobin: 12.8 g/dL — ABNORMAL LOW (ref 13.0–17.0)
Immature Granulocytes: 0 %
Lymphocytes Relative: 23 %
Lymphs Abs: 2.3 K/uL (ref 0.7–4.0)
MCH: 31.3 pg (ref 26.0–34.0)
MCHC: 34.6 g/dL (ref 30.0–36.0)
MCV: 90.5 fL (ref 80.0–100.0)
Monocytes Absolute: 0.7 K/uL (ref 0.1–1.0)
Monocytes Relative: 7 %
Neutro Abs: 6.7 K/uL (ref 1.7–7.7)
Neutrophils Relative %: 67 %
Platelet Count: 211 K/uL (ref 150–400)
RBC: 4.09 MIL/uL — ABNORMAL LOW (ref 4.22–5.81)
RDW: 13.9 % (ref 11.5–15.5)
WBC Count: 9.9 K/uL (ref 4.0–10.5)
nRBC: 0 % (ref 0.0–0.2)

## 2024-01-17 LAB — MAGNESIUM: Magnesium: 1.7 mg/dL (ref 1.7–2.4)

## 2024-01-17 LAB — PSA: Prostatic Specific Antigen: 1.07 ng/mL (ref 0.00–4.00)

## 2024-01-17 MED ORDER — SODIUM CHLORIDE 0.9 % IV SOLN
INTRAVENOUS | Status: DC
  Filled 2024-01-17 (×2): qty 250

## 2024-01-17 MED ORDER — POTASSIUM CHLORIDE 20 MEQ/100ML IV SOLN
20.0000 meq | Freq: Once | INTRAVENOUS | Status: AC
  Administered 2024-01-17: 20 meq via INTRAVENOUS

## 2024-01-17 MED ORDER — POTASSIUM CHLORIDE CRYS ER 20 MEQ PO TBCR: 20.0000 meq | EXTENDED_RELEASE_TABLET | Freq: Two times a day (BID) | ORAL | 0 refills | Status: DC

## 2024-01-17 NOTE — Telephone Encounter (Signed)
 Per Almarie PEAK pt has a critical potassium level of 2.6. per Dr. Babara, she requested pt to be seen in smc today with IV potassium.  I called patient to come back to clinic. He stated he could be back in the next 10-15 mins. Pt added to schedule.

## 2024-01-17 NOTE — Telephone Encounter (Signed)
 Attempted to reach Dr. Albina to collaborate with patient's care. I left a voice mail for office to call our office back and to request that they reach out to patient to obtain apt. Pcp needs to manage pt's side effects of metformin . Patient came to clinic today for labs and was found to be hypokalemic. Patient received iv potassium 20 meq today. Will come back tomorrow for repeat labs and iv potassium. Pt given oral rx for potassium at d/c.

## 2024-01-17 NOTE — Progress Notes (Signed)
 Symptom Management Clinic [facility] Telephone:([phone] [phone]  Patient Care Team: Albina GORMAN Dine, MD as PCP - General (Internal Medicine) Babara Call, MD as Consulting Physician (Hematology and Oncology)   NAME OF PATIENT: Jose Randolph  [id number]  [birth year]   DATE OF VISIT: 01/17/24  REASON FOR CONSULT: HAJIME ASFAW is a 73 y.o. 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   INTERVAL HISTORY: Patient presented to clinic for labs in anticipation of MD visit next week.  Was found to have severe hypokalemia and was an add-on to [facility] to evaluate.  Patient reports chronic diarrhea for at least a year with increased frequency of watery stools over the past month.  Patient says that he often will have 7-8 loose stools in a 24-hour period.  He says that his PCP is aware.  Patient attributes this to his metformin  as he says that the diarrhea started at the time that patient was started on metformin .  Patient denies nausea or vomiting.  Denies bloody stools.  Denies abdominal pain or distention.  Patient is on HCTZ.  Denies any neurologic complaints. Denies recent fevers or illnesses. Denies any easy bleeding or bruising. Reports good appetite and denies weight loss. Denies chest pain. Denies urinary complaints. Patient offers no further specific complaints today.   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 Excised, margin involved.    PAST SURGICAL HISTORY:  Past Surgical History:  Procedure Laterality Date   COLONOSCOPY N/A 10/08/2023   Procedure: COLONOSCOPY;  Surgeon: Maryruth Ole DASEN, MD;  Location: [facility]  ENDOSCOPY;  Service: Endoscopy;  Laterality: N/A;  DM   COLONOSCOPY WITH PROPOFOL  N/A 10/28/2019   Procedure: COLONOSCOPY WITH PROPOFOL ;  Surgeon: Maryruth Ole DASEN, MD;  Location: [facility] ENDOSCOPY;  Service: Endoscopy;  Laterality: N/A;   FRACTURE SURGERY      HEMATOLOGY/ONCOLOGY HISTORY:  Oncology History  Prostate cancer (HCC)  04/21/2015 Cancer Staging   Staging form: Prostate, AJCC 8th Edition - Clinical stage from 04/21/2015: Stage IIB (cT1, cN0, cM0, PSA: 10, Grade Group: 2) - Signed by Babara Call, MD on 12/20/2021 Stage prefix: Initial diagnosis Prostate specific antigen (PSA) range: 10 to 19 Gleason score: 7 Histologic grading system: 5 grade system   04/21/2015 Initial Diagnosis   Prostate cancer   -Per urologist who has obtained previous records, prostate biopsy 04/21/2015 which did show a focus of Gleason 3+3 adenocarcinoma in the left mid core involving 5% of the submitted tissue -Initiated patient elected active surveillance. -MRI prostate in 2017 showed no suspicious lesion in the prostate volume of 79 cc. -April 2018 biopsy showed benign tissue and a focus of atypical glands suspicious for adenocarcinoma right base.  PSA was elevated at 4.99     05/13/2018 Procedure   Per Urology note MR fusion biopsy [facility] 05/12/2020; prostate volume 72 g ROI biopsies x7 taken all showing benign prostate tissue LML core + Gleason 4+3 adenocarcinoma involving 15% of the submitted tissue LAL core + Gleason 3+3 adenocarcinoma involving 5% of submitted tissue Remaining biopsies with benign prostate tissue   03/31/2020 Tumor Marker   PSA 10.2.   04/14/2020 Imaging   MRI prostate  with and without contrast -1. PI-RADS category 4 lesion of the left anterior peripheral zone in the mid gland and apex. 2. PI-RADS category 3 lesion of the right anterior and posterolateral peripheral zone at the apex. 3. Targeting data sent to [facility]. 4. Benign prostatic hypertrophy, prostate volume 102.85  cubic cm   06/16/2020 - 08/11/2020 Radiation Therapy   Prostate radiation   12/06/2020 Tumor Marker   PSA 0.11   06/06/2021 Tumor Marker   PSA 0.74   12/12/2021 Tumor Marker   PSA  2.33.   12/27/2021 Imaging   PSMA PET scan pending   01/31/2022 Genetic Testing   Negative genetic testing. No pathogenic variants identified on the [facility] Common Hereditary Cancers+RNA panel. The report date is 01/27/2022.  The Common Hereditary Cancers Panel + RNA offered by [facility] includes sequencing and/or deletion duplication testing of the following 48 genes: APC*, ATM*, AXIN2, BAP1, BARD1, BMPR1A, BRCA1, BRCA2, BRIP1, CDH1, CDK4, CDKN2A (p14ARF), CDKN2A (p16INK4a), CHEK2, CTNNA1, DICER1*, EPCAM*, FH*, GREM1*, HOXB13, KIT, MBD4, MEN1*, MLH1*, MSH2*, MSH3*, MSH6*, MUTYH, NF1*, NTHL1, PALB2, PDGFRA, PMS2*, POLD1*, POLE, PTEN*, RAD51C, RAD51D, SDHA*, SDHB, SDHC*, SDHD, SMAD4, SMARCA4, STK11, TP53, TSC1*, TSC2, VHL.    04/03/2022 Tumor Marker   PSA 1.55     ALLERGIES:  is allergic to oxycodone and bee pollen.  MEDICATIONS:  Current Outpatient Medications  Medication Sig Dispense Refill   atorvastatin  (LIPITOR) 40 MG tablet TAKE 1 TABLET BY MOUTH EVERY DAY 90 tablet 1   carvedilol  (COREG  CR) 20 MG 24 hr capsule TAKE 1 CAPSULE BY MOUTH EVERY DAY IN THE MORNING 90 capsule 1   Cholecalciferol 25 MCG (1000 UT) tablet Take by mouth.     diclofenac Sodium (VOLTAREN) 1 % GEL Apply 4 g topically in the morning and at bedtime. 240 g 2   FARXIGA  10 MG TABS tablet TAKE 1 TABLET BY MOUTH EVERY DAY 90 tablet 0   halobetasol (ULTRAVATE) 0.05 % ointment Apply topically.     Lancets (ONETOUCH ULTRASOFT) lancets USE WITH DEVICE TO CHECK SUGARS TWICE DAILY DX E11.9     Mechlorethamine HCl (VALCHLOR) 0.016 % GEL Apply topically.     meloxicam (MOBIC) 15 MG tablet Take 1 tablet (15 mg total) by mouth daily. 30 tablet 0   metFORMIN  (GLUCOPHAGE -XR) 500 MG 24 hr tablet Take 4 tablets (2,000 mg total) by mouth daily with  breakfast. TAKE 4 TABLETS BY MOUTH DAILY WITH BREAKFAST 360 tablet 1   Olmesartan -amLODIPine -HCTZ 40-10-12.5 MG TABS TAKE 1 TABLET BY MOUTH EVERY DAY IN THE MORNING 90 tablet 0   ONETOUCH ULTRA test strip 2 (two) times daily.     tadalafil (CIALIS) 20 MG tablet EVERY 36 HOURS AS NEEDED FOR ED.     tamsulosin  (FLOMAX ) 0.4 MG CAPS capsule TAKE 1 CAPSULE BY MOUTH 2 TIMES DAILY. 180 capsule 3   No current facility-administered medications for this visit.    VITAL SIGNS: BP 109/66 (Patient Position: Sitting, Cuff Size: Large)   Pulse 64   Temp (!) 97.1 F (36.2 C) (Tympanic)   Resp 20   Ht 5' 10 (1.778 m)   BMI 31.42 kg/m  There were no vitals filed for this visit.  Estimated body mass index is 31.42 kg/m as calculated from the following:   Height as of this encounter: 5' 10 (1.778 m).   Weight as of 12/24/23: 219 lb (99.3 kg).  LABS: CBC:    Component Value Date/Time   WBC 9.9 01/17/2024 0925   WBC 4.8 07/04/2022 0757  HGB 12.8 (L) 01/17/2024 0925   HGB 13.7 05/16/2022 0841   HCT 37.0 (L) 01/17/2024 0925   HCT 42.1 05/16/2022 0841   PLT 211 01/17/2024 0925   PLT 196 05/16/2022 0841   MCV 90.5 01/17/2024 0925   MCV 92 05/16/2022 0841   NEUTROABS 6.7 01/17/2024 0925   NEUTROABS 2.7 05/16/2022 0841   LYMPHSABS 2.3 01/17/2024 0925   LYMPHSABS 1.2 05/16/2022 0841   MONOABS 0.7 01/17/2024 0925   EOSABS 0.2 01/17/2024 0925   EOSABS 0.2 05/16/2022 0841   BASOSABS 0.1 01/17/2024 0925   BASOSABS 0.1 05/16/2022 0841   Comprehensive Metabolic Panel:    Component Value Date/Time   NA 144 01/17/2024 0924   NA 143 09/17/2023 1010   K 2.6 (LL) 01/17/2024 0924   CL 108 01/17/2024 0924   CO2 25 01/17/2024 0924   BUN 11 01/17/2024 0924   BUN 18 09/17/2023 1010   CREATININE 0.73 01/17/2024 0924   GLUCOSE 129 (H) 01/17/2024 0924   CALCIUM  9.1 01/17/2024 0924   AST 22 01/17/2024 0924   ALT 21 01/17/2024 0924   ALKPHOS 62 01/17/2024 0924   BILITOT 1.7 (H) 01/17/2024 0924    PROT 7.1 01/17/2024 0924   PROT 6.4 12/18/2023 0822   ALBUMIN 3.8 01/17/2024 0924   ALBUMIN 3.9 12/18/2023 0822    RADIOGRAPHIC STUDIES: No results found.  PERFORMANCE STATUS (ECOG) : 1 - Symptomatic but completely ambulatory  Review of Systems Unless otherwise noted, a complete review of systems is negative.  Physical Exam General: NAD Cardiovascular: regular rate and rhythm Pulmonary: clear ant fields Abdomen: soft, nontender, + bowel sounds GU: no suprapubic tenderness Extremities: no edema, no joint deformities Skin: no rashes Neurological: Nonfocal  IMPRESSION/PLAN: Prostate cancer -PSA pending.  Patient status post definitive radiation.  Has MD follow-up next week.  Hypokalemia -likely from GI loss due to chronic diarrhea.  Will replete with IV K 20 mEq today and start on K-Dur 20 mEq BID x 10 days.  Magnesium normal. Discussed option of ED but patient declined.   Diarrhea -unclear etiology.  Patient attributes this to metformin .  This has been a chronic problem ongoing for many months.  Will refer back to GI.  Patient has seen Dr. Maryruth in the past.   He will return to our clinic tomorrow for repeat labs and possible K. Message sent to PCP.  Patient will follow-up with PCP ASAP. ED triggers reviewed.   Case and plan discussed with Dr. Babara   Patient expressed understanding and was in agreement with this plan. He also understands that He can call clinic at any time with any questions, concerns, or complaints.   Thank you for allowing me to participate in the care of this very pleasant patient.   Time Total: 40 minutes  Visit consisted of counseling and education dealing with the complex and emotionally intense issues of symptom management in the setting of serious illness.Greater than 50%  of this time was spent counseling and coordinating care related to the above assessment and plan.  Signed by: Fonda Mower, PhD, NP-C

## 2024-01-17 NOTE — Patient Instructions (Signed)
 Potassium Chloride Injection What is this medication? POTASSIUM CHLORIDE (poe TASS i um KLOOR ide) prevents and treats low levels of potassium in your body. Potassium plays an important role in maintaining the health of your kidneys, heart, muscles, and nervous system. This medicine may be used for other purposes; ask your health care provider or pharmacist if you have questions. COMMON BRAND NAME(S): PROAMP What should I tell my care team before I take this medication? They need to know if you have any of these conditions: Addison disease Dehydration Diabetes (high blood sugar) Heart disease High levels of potassium in the blood Irregular heartbeat or rhythm Kidney disease Large areas of burned skin An unusual or allergic reaction to potassium, other medications, foods, dyes, or preservatives Pregnant or trying to get pregnant Breast-feeding How should I use this medication? This medication is injected into a vein. It is given in a hospital or clinic setting. Talk to your care team about the use of this medication in children. Special care may be needed. Overdosage: If you think you have taken too much of this medicine contact a poison control center or emergency room at once. NOTE: This medicine is only for you. Do not share this medicine with others. What if I miss a dose? This does not apply. This medication is not for regular use. What may interact with this medication? Do not take this medication with any of the following: Certain diuretics, such as spironolactone, triamterene Eplerenone Sodium polystyrene sulfonate This medication may also interact with the following: Certain medications for blood pressure or heart disease, such as lisinopril, losartan, quinapril, valsartan Medications that lower your chance of fighting infection, such as cyclosporine, tacrolimus NSAIDs, medications for pain and inflammation, such as ibuprofen or naproxen Other potassium supplements Salt  substitutes This list may not describe all possible interactions. Give your health care provider a list of all the medicines, herbs, non-prescription drugs, or dietary supplements you use. Also tell them if you smoke, drink alcohol, or use illegal drugs. Some items may interact with your medicine. What should I watch for while using this medication? Visit your care team for regular checks on your progress. Tell your care team if your symptoms do not start to get better or if they get worse. You may need blood work while you are taking this medication. Avoid salt substitutes unless you are told otherwise by your care team. What side effects may I notice from receiving this medication? Side effects that you should report to your care team as soon as possible: Allergic reactions--skin rash, itching, hives, swelling of the face, lips, tongue, or throat High potassium level--muscle weakness, fast or irregular heartbeat Side effects that usually do not require medical attention (report to your care team if they continue or are bothersome): Diarrhea Nausea Stomach pain Vomiting This list may not describe all possible side effects. Call your doctor for medical advice about side effects. You may report side effects to FDA at 1-800-FDA-1088. Where should I keep my medication? This medication is given in a hospital or clinic. It will not be stored at home. NOTE: This sheet is a summary. It may not cover all possible information. If you have questions about this medicine, talk to your doctor, pharmacist, or health care provider.  2024 Elsevier/Gold Standard (2021-09-02 00:00:00)

## 2024-01-17 NOTE — Telephone Encounter (Signed)
 Critical lab received from Harlene Kiang at [place] lab.  Potassium 2.6  MD notified and would like pt to see NP and get IVK today.   Message sent to [facility] team to set up appt.

## 2024-01-17 NOTE — Telephone Encounter (Signed)
 Heather from the cancer center called to let you know that the patient had to receive IV Potassium today bc he was hypokelemic, she states that his metformin  is causing him upset stomach as well

## 2024-01-18 ENCOUNTER — Emergency Department
Admission: EM | Admit: 2024-01-18 | Discharge: 2024-01-18 | Disposition: A | Discharge status: Home / Self Care | Source: Ambulatory Visit | Attending: Emergency Medicine | Admitting: Emergency Medicine

## 2024-01-18 ENCOUNTER — Telehealth: Payer: Self-pay | Admitting: *Deleted

## 2024-01-18 ENCOUNTER — Inpatient Hospital Stay

## 2024-01-18 ENCOUNTER — Ambulatory Visit: Payer: Self-pay | Admitting: Oncology

## 2024-01-18 ENCOUNTER — Encounter: Payer: Self-pay | Admitting: Oncology

## 2024-01-18 ENCOUNTER — Inpatient Hospital Stay: Admitting: Oncology

## 2024-01-18 ENCOUNTER — Other Ambulatory Visit: Payer: Self-pay

## 2024-01-18 VITALS — BP 128/66 | HR 74 | Temp 97.2°F | Resp 18 | Wt 214.1 lb

## 2024-01-18 DIAGNOSIS — E876 Hypokalemia: Secondary | ICD-10-CM

## 2024-01-18 DIAGNOSIS — I10 Essential (primary) hypertension: Secondary | ICD-10-CM | POA: Insufficient documentation

## 2024-01-18 DIAGNOSIS — Z8546 Personal history of malignant neoplasm of prostate: Secondary | ICD-10-CM | POA: Diagnosis not present

## 2024-01-18 DIAGNOSIS — C61 Malignant neoplasm of prostate: Secondary | ICD-10-CM | POA: Diagnosis not present

## 2024-01-18 DIAGNOSIS — K529 Noninfective gastroenteritis and colitis, unspecified: Secondary | ICD-10-CM

## 2024-01-18 DIAGNOSIS — E119 Type 2 diabetes mellitus without complications: Secondary | ICD-10-CM | POA: Insufficient documentation

## 2024-01-18 LAB — CBC WITH DIFFERENTIAL/PLATELET
Abs Immature Granulocytes: 0.02 K/uL (ref 0.00–0.07)
Basophils Absolute: 0.1 K/uL (ref 0.0–0.1)
Basophils Relative: 1 %
Eosinophils Absolute: 0.2 K/uL (ref 0.0–0.5)
Eosinophils Relative: 2 %
HCT: 37 % — ABNORMAL LOW (ref 39.0–52.0)
Hemoglobin: 12.7 g/dL — ABNORMAL LOW (ref 13.0–17.0)
Immature Granulocytes: 0 %
Lymphocytes Relative: 22 %
Lymphs Abs: 1.9 K/uL (ref 0.7–4.0)
MCH: 31.4 pg (ref 26.0–34.0)
MCHC: 34.3 g/dL (ref 30.0–36.0)
MCV: 91.6 fL (ref 80.0–100.0)
Monocytes Absolute: 0.6 K/uL (ref 0.1–1.0)
Monocytes Relative: 7 %
Neutro Abs: 5.7 K/uL (ref 1.7–7.7)
Neutrophils Relative %: 68 %
Platelets: 217 K/uL (ref 150–400)
RBC: 4.04 MIL/uL — ABNORMAL LOW (ref 4.22–5.81)
RDW: 13.9 % (ref 11.5–15.5)
WBC: 8.3 K/uL (ref 4.0–10.5)
nRBC: 0 % (ref 0.0–0.2)

## 2024-01-18 LAB — COMPREHENSIVE METABOLIC PANEL WITH GFR
ALT: 20 U/L (ref 0–44)
AST: 18 U/L (ref 15–41)
Albumin: 4.2 g/dL (ref 3.5–5.0)
Alkaline Phosphatase: 74 U/L (ref 38–126)
Anion gap: 10 (ref 5–15)
BUN: 7 mg/dL — ABNORMAL LOW (ref 8–23)
CO2: 28 mmol/L (ref 22–32)
Calcium: 9.3 mg/dL (ref 8.9–10.3)
Chloride: 107 mmol/L (ref 98–111)
Creatinine, Ser: 0.62 mg/dL (ref 0.61–1.24)
GFR, Estimated: >60 mL/min (ref >60)
Glucose, Bld: 147 mg/dL — ABNORMAL HIGH (ref 70–99)
Potassium: 2.6 mmol/L — CL (ref 3.5–5.1)
Sodium: 145 mmol/L (ref 135–145)
Total Bilirubin: 1.2 mg/dL (ref 0.0–1.2)
Total Protein: 6.7 g/dL (ref 6.5–8.1)

## 2024-01-18 LAB — POTASSIUM
Potassium: 2.7 mmol/L — CL (ref 3.5–5.1)
Potassium: 3.4 mmol/L — ABNORMAL LOW (ref 3.5–5.1)

## 2024-01-18 MED ORDER — POTASSIUM CHLORIDE CRYS ER 20 MEQ PO TBCR
40.0000 meq | EXTENDED_RELEASE_TABLET | Freq: Once | ORAL | Status: AC
  Administered 2024-01-18: 40 meq via ORAL
  Filled 2024-01-18: qty 2

## 2024-01-18 MED ORDER — POTASSIUM CHLORIDE 10 MEQ/100ML IV SOLN
10.0000 meq | Freq: Once | INTRAVENOUS | Status: AC
  Administered 2024-01-18: 10 meq via INTRAVENOUS
  Filled 2024-01-18: qty 100

## 2024-01-18 NOTE — ED Triage Notes (Signed)
 Patient states he had low potassium yesterday; was given a 10 meq potassium infusion and one 20 meq tablet. Today had potassium re collected and it only went from 2.6 to 2.7.

## 2024-01-18 NOTE — ED Provider Notes (Signed)
 [facility] Provider Note    Event Date/Time   First MD Initiated Contact with Patient 01/18/24 1158     (approximate)   History   Abnormal Labs   HPI  Jose Randolph is a 73 y.o. male with his stage IIb prostate cancer, hypertension, diabetes who presents with hypokalemia.  The patient had labs drawn yesterday which showed hypokalemia and was treated with IV and oral potassium, but when his labs were rechecked today the potassium went up from 2.6-2.7.  The patient denies any weakness, numbness, paresthesias, lightheadedness, palpitations, muscle spasms or cramps, or other acute symptoms.  I reviewed the past medical records.  The patient was seen by hospice/palliative yesterday for labs.  He reported chronic diarrhea and was diagnosed with hypokalemia thought to be due to GI losses and given IV and oral potassium.   Physical Exam   Triage Vital Signs: ED Triage Vitals  Encounter Vitals Group     BP 01/18/24 1111 120/66     Girls Systolic BP Percentile --      Girls Diastolic BP Percentile --      Boys Systolic BP Percentile --      Boys Diastolic BP Percentile --      Pulse Rate 01/18/24 1111 76     Resp 01/18/24 1111 18     Temp 01/18/24 1111 99.1 F (37.3 C)     Temp Source 01/18/24 1111 Oral     SpO2 01/18/24 1111 98 %     Weight 01/18/24 1112 214 lb (97.1 kg)     Height 01/18/24 1112 5' 10 (1.778 m)     Head Circumference --      Peak Flow --      Pain Score --      Pain Loc --      Pain Education --      Exclude from Growth Chart --     Most recent vital signs: Vitals:   01/18/24 1111 01/18/24 1536  BP: 120/66 120/60  Pulse: 76 70  Resp: 18 18  Temp: 99.1 F (37.3 C) 98.4 F (36.9 C)  SpO2: 98% 98%     General: Alert, relatively well-appearing, no distress.  CV:  Good peripheral perfusion.  Resp:  Normal effort.  Abd:  No distention. Other:  Moist mucous membranes.   ED Results / Procedures / Treatments   Labs (all  labs ordered are listed, but only abnormal results are displayed) Labs Reviewed  COMPREHENSIVE METABOLIC PANEL WITH GFR - Abnormal; Notable for the following components:      Result Value   Potassium 2.6 (*)    Glucose, Bld 147 (*)    BUN 7 (*)    All other components within normal limits  CBC WITH DIFFERENTIAL/PLATELET - Abnormal; Notable for the following components:   RBC 4.04 (*)    Hemoglobin 12.7 (*)    HCT 37.0 (*)    All other components within normal limits  POTASSIUM - Abnormal; Notable for the following components:   Potassium 3.4 (*)    All other components within normal limits     EKG  ED ECG REPORT I, Waylon Cassis, the attending physician, personally viewed and interpreted this ECG.  Date: 01/18/2024 EKG Time: 1321 Rate: 68 Rhythm: normal sinus rhythm QRS Axis: normal Intervals: normal ST/T Wave abnormalities: Nonspecific T wave abnormality Narrative Interpretation: no evidence of acute ischemia    RADIOLOGY    PROCEDURES:  Critical Care performed: No  Procedures  MEDICATIONS ORDERED IN ED: Medications  potassium chloride 10 mEq in 100 mL IVPB (0 mEq Intravenous Stopped 01/18/24 1505)  potassium chloride 10 mEq in 100 mL IVPB (0 mEq Intravenous Stopped 01/18/24 1405)  potassium chloride SA (KLOR-CON M) CR tablet 40 mEq (40 mEq Oral Given 01/18/24 1303)     IMPRESSION / MDM / ASSESSMENT AND PLAN / ED COURSE  I reviewed the triage vital signs and the nursing notes.  73 year old male with PMH as noted above presents with persistent hypokalemia despite IV potassium treatment yesterday as well as oral potassium which he took last night.  On exam his vital signs are normal.  Physical exam is unremarkable for acute findings.  Differential diagnosis includes, but is not limited to, hypokalemia.  CMP here confirms potassium of 2.6.  CBC is unremarkable.  I have added on an EKG.  We will give with IV and p.o. potassium and reassess.  Patient's  presentation is most consistent with acute presentation with potential threat to life or bodily function.  The patient is on the cardiac monitor to evaluate for evidence of arrhythmia and/or significant heart rate changes.  ----------------------------------------- 4:16 PM on 01/18/2024 -----------------------------------------  Repeat potassium is 3.4.  I did initially consider whether the patient may require inpatient admission, however given this good response to the potassium and the patient's lack of any symptoms of hypokalemia or EKG findings, the patient is stable for discharge home.  I did extensive discussion with him.  He has already been prescribed for oral potassium and I advised him to continue it until he follows up with his primary care provider for a potassium recheck next week.  He is also very concerned about continuing his metformin  because he feels that this is the likely culprit of his chronic diarrhea.  However I advised him that I did not feel comfortable making changes in his long-term diabetes medication from the ED.  He should continue his metformin  through the weekend and contact his doctor on Monday.  He is in agreement with this plan.  I gave strict return precautions, and he expressed understanding.   FINAL CLINICAL IMPRESSION(S) / ED DIAGNOSES   Final diagnoses:  Hypokalemia     Rx / DC Orders   ED Discharge Orders     None        Note:  This document was prepared using Dragon voice recognition software and may include unintentional dictation errors.    Jacolyn Pae, MD 01/18/24 [phone]

## 2024-01-18 NOTE — Telephone Encounter (Signed)
 Noted

## 2024-01-18 NOTE — Progress Notes (Signed)
 Hematology/Oncology Progress note [phone] [phone]        REFERRING PROVIDER: Albina GORMAN Dine, MD   CHIEF COMPLAINTS/PURPOSE OF CONSULTATION:  Prostate cancer  ASSESSMENT & PLAN:   Cancer Staging  Prostate cancer [facility]) Staging form: Prostate, AJCC 8th Edition - Clinical stage from 04/21/2015: Stage IIB (cT1, cN0, cM0, PSA: 10, Grade Group: 2) - Signed by Babara Call, MD on 12/20/2021   Prostate cancer [facility]) Prostate cancer status post definitive radiation.   12/12/21 2023 PSA trended up without any definitive radiographic evidence of recurrence. increase of 2 ng/ ml above nadir. Raising suspicion of biochemical recurrence. [Interval between radiation and recurrence less than 18 months.  High risk.] Jan 2024 to present  PSA decreased without any intervention.  PSA remain stable at 1.07  Continue observation.   Hypokalemia Refractory hypokalemia of 2.7,  despite received IV KCL 20meq and took 20meq oral KCL yesterday.  Recommend patient to go to ER for further evaluation of treatment.  Etiology of hypokalemia is likely due to GI loss from diarrhea.   Chronic diarrhea Possibly due to metformin . Recommend patient to follow up with PCP for further evaluation and management.    Orders Placed This Encounter  Procedures   PSA    Standing Status:   Future    Expected Date:   07/17/2024    Expiration Date:   10/15/2024   CBC (Cancer Center Only)    Standing Status:   Future    Expected Date:   07/17/2024    Expiration Date:   10/15/2024   Comprehensive metabolic panel with GFR    Standing Status:   Future    Expected Date:   07/17/2024    Expiration Date:   10/15/2024   Follow-up in 6 months. All questions were answered. The patient knows to call the clinic with any problems, questions or concerns.  Call Babara, MD, PhD [facility] Hematology Oncology 01/18/2024       HISTORY OF PRESENTING ILLNESS:  Jose Randolph 73 y.o. male presents to establish  care for  I have reviewed his chart and materials related to his cancer extensively and collaborated history with the patient. Summary of oncologic history is as follows: Oncology History  Prostate cancer (HCC)  04/21/2015 Cancer Staging   Staging form: Prostate, AJCC 8th Edition - Clinical stage from 04/21/2015: Stage IIB (cT1, cN0, cM0, PSA: 10, Grade Group: 2) - Signed by Babara Call, MD on 12/20/2021 Stage prefix: Initial diagnosis Prostate specific antigen (PSA) range: 10 to 19 Gleason score: 7 Histologic grading system: 5 grade system   04/21/2015 Initial Diagnosis   Prostate cancer   -Per urologist who has obtained previous records, prostate biopsy 04/21/2015 which did show a focus of Gleason 3+3 adenocarcinoma in the left mid core involving 5% of the submitted tissue -Initiated patient elected active surveillance. -MRI prostate in 2017 showed no suspicious lesion in the prostate volume of 79 cc. -April 2018 biopsy showed benign tissue and a focus of atypical glands suspicious for adenocarcinoma right base.  PSA was elevated at 4.99     05/13/2018 Procedure   Per Urology note MR fusion biopsy [facility] 05/12/2020; prostate volume 72 g ROI biopsies x7 taken all showing benign prostate tissue LML core + Gleason 4+3 adenocarcinoma involving 15% of the submitted tissue LAL core + Gleason 3+3 adenocarcinoma involving 5% of submitted tissue Remaining biopsies with benign prostate tissue   03/31/2020 Tumor Marker   PSA 10.2.   04/14/2020 Imaging   MRI  prostate with and without contrast -1. PI-RADS category 4 lesion of the left anterior peripheral zone in the mid gland and apex. 2. PI-RADS category 3 lesion of the right anterior and posterolateral peripheral zone at the apex. 3. Targeting data sent to [facility]. 4. Benign prostatic hypertrophy, prostate volume 102.85 cubic cm   06/16/2020 - 08/11/2020 Radiation Therapy   Prostate radiation   12/06/2020 Tumor Marker   PSA 0.11   06/06/2021  Tumor Marker   PSA 0.74   12/12/2021 Tumor Marker   PSA  2.33.   12/27/2021 Imaging   PSMA PET   1. Nonspecific activity in the prostate gland. 2. No evidence metastatic adenopathy in the pelvis or periaortic retroperitoneum. 3. No evidence of visceral metastasis or skeletal metastasis   01/31/2022 Genetic Testing   Negative genetic testing. No pathogenic variants identified on the [facility] Common Hereditary Cancers+RNA panel. The report date is 01/27/2022.  The Common Hereditary Cancers Panel + RNA offered by [facility] includes sequencing and/or deletion duplication testing of the following 48 genes: APC*, ATM*, AXIN2, BAP1, BARD1, BMPR1A, BRCA1, BRCA2, BRIP1, CDH1, CDK4, CDKN2A (p14ARF), CDKN2A (p16INK4a), CHEK2, CTNNA1, DICER1*, EPCAM*, FH*, GREM1*, HOXB13, KIT, MBD4, MEN1*, MLH1*, MSH2*, MSH3*, MSH6*, MUTYH, NF1*, NTHL1, PALB2, PDGFRA, PMS2*, POLD1*, POLE, PTEN*, RAD51C, RAD51D, SDHA*, SDHB, SDHC*, SDHD, SMAD4, SMARCA4, STK11, TP53, TSC1*, TSC2, VHL.    04/03/2022 Tumor Marker   PSA 1.55    Patient was referred to establish care with medical oncology due to increase of PSA level. Patient was accompanied by wife.  Patient denies  urinary urgency, dysuria.  Occasionally patient has loose bowel movements, manageable with Imodium as needed.  Patient attributes to metformin  use.  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 Chandra and is currently on phototherapy. Patient reported family history of prostate cancer and colon cancer.    INTERVAL HISTORY Jose Randolph is a 73 y.o. male who has above history reviewed by me today presents for follow up visit for prostate cancer.  His blood work yesterday showed severe hypokalemia of 2.6.  He received IV potassium chloride 20meq and was prescribed potassium chloride 20meq BID. Today repeat lab showed K level of 2.7. He has chronic intermittent diarrhea. He is on metformin . No abdominal pain,  blood in stool. No nausea vomiting.    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 Excised, margin involved.    SURGICAL HISTORY: Past Surgical History:  Procedure Laterality Date   COLONOSCOPY N/A 10/08/2023   Procedure: COLONOSCOPY;  Surgeon: Maryruth Ole DASEN, MD;  Location[facility] ENDOSCOPY;  Service: Endoscopy;  Laterality: N/A;  DM   COLONOSCOPY WITH PROPOFOL  N/A 10/28/2019   Procedure: COLONOSCOPY WITH PROPOFOL ;  Surgeon: Maryruth Ole DASEN, MD;  Location: [facility] ENDOSCOPY;  Service: Endoscopy;  Laterality: N/A;   FRACTURE SURGERY      SOCIAL HISTORY: Social History   Socioeconomic History   Marital status: Married    Spouse name: Not on file   Number of children: Not on file   Years of education: Not on file   Highest education level: Not on file  Occupational History   Not on file  Tobacco Use   Smoking status: Former  Current packs/day: 0.00    Average packs/day: 0.5 packs/day for 5.0 years (2.5 ttl pk-yrs)    Types: Cigarettes    Start date: 03/06/1978    Quit date: 03/07/1983    Years since quitting: 40.8   Smokeless tobacco: Never  Vaping Use   Vaping status: Never Used  Substance and Sexual Activity   Alcohol use: Yes   Drug use: Never   Sexual activity: Yes    Birth control/protection: None  Other Topics Concern   Not on file  Social History Narrative   Not on file   Social Drivers of Health   Financial Resource Strain: Not on file  Food Insecurity: Not on file  Transportation Needs: Not on file  Physical Activity: Not on file  Stress: Not on file  Social Connections: Not on file  Intimate Partner Violence: Not on file    FAMILY HISTORY: Family History  Problem Relation Age of Onset   Colon cancer  Father        dx 31s   Prostate cancer Brother 36   Prostate cancer Brother 104   Kidney cancer Brother 109   Cancer Maternal Aunt        unk type   Lung cancer Maternal Uncle    Leukemia Grandson 2    ALLERGIES:  is allergic to oxycodone and bee pollen.  MEDICATIONS:  Current Outpatient Medications  Medication Sig Dispense Refill   atorvastatin  (LIPITOR) 40 MG tablet TAKE 1 TABLET BY MOUTH EVERY DAY 90 tablet 1   carvedilol  (COREG  CR) 20 MG 24 hr capsule TAKE 1 CAPSULE BY MOUTH EVERY DAY IN THE MORNING 90 capsule 1   Cholecalciferol 25 MCG (1000 UT) tablet Take by mouth.     diclofenac Sodium (VOLTAREN) 1 % GEL Apply 4 g topically in the morning and at bedtime. 240 g 2   halobetasol (ULTRAVATE) 0.05 % ointment Apply topically.     Lancets (ONETOUCH ULTRASOFT) lancets USE WITH DEVICE TO CHECK SUGARS TWICE DAILY DX E11.9     Mechlorethamine HCl (VALCHLOR) 0.016 % GEL Apply topically.     meloxicam (MOBIC) 15 MG tablet Take 1 tablet (15 mg total) by mouth daily. 30 tablet 0   metFORMIN  (GLUCOPHAGE -XR) 500 MG 24 hr tablet Take 4 tablets (2,000 mg total) by mouth daily with breakfast. TAKE 4 TABLETS BY MOUTH DAILY WITH BREAKFAST 360 tablet 1   Olmesartan -amLODIPine -HCTZ 40-10-12.5 MG TABS TAKE 1 TABLET BY MOUTH EVERY DAY IN THE MORNING 90 tablet 0   ONETOUCH ULTRA test strip 2 (two) times daily.     potassium chloride SA (KLOR-CON M) 20 MEQ tablet Take 1 tablet (20 mEq total) by mouth 2 (two) times daily. 20 tablet 0   tadalafil (CIALIS) 20 MG tablet EVERY 36 HOURS AS NEEDED FOR ED.     tamsulosin  (FLOMAX ) 0.4 MG CAPS capsule TAKE 1 CAPSULE BY MOUTH 2 TIMES DAILY. 180 capsule 3   FARXIGA  10 MG TABS tablet TAKE 1 TABLET BY MOUTH EVERY DAY 90 tablet 0   No current facility-administered medications for this visit.    Review of Systems  Constitutional:  Negative for appetite change, chills, fatigue, fever and unexpected weight change.  HENT:   Negative for hearing loss and voice change.    Eyes:  Negative for eye problems and icterus.  Respiratory:  Negative for chest tightness, cough and shortness of breath.   Cardiovascular:  Negative for chest pain and leg swelling.  Gastrointestinal:  Positive for diarrhea. Negative for abdominal distention  and abdominal pain.  Endocrine: Negative for hot flashes.  Genitourinary:  Negative for difficulty urinating, dysuria and frequency.   Musculoskeletal:  Negative for arthralgias.  Skin:  Negative for itching and rash.  Neurological:  Negative for light-headedness and numbness.  Hematological:  Negative for adenopathy. Does not bruise/bleed easily.  Psychiatric/Behavioral:  Negative for confusion.      PHYSICAL EXAMINATION: ECOG PERFORMANCE STATUS: 1 - Symptomatic but completely ambulatory  Vitals:   01/18/24 1007  BP: 128/66  Pulse: 74  Resp: 18  Temp: (!) 97.2 F (36.2 C)   Filed Weights   01/18/24 1007  Weight: 214 lb 1.6 oz (97.1 kg)    Physical Exam Constitutional:      Appearance: Normal appearance. He is not diaphoretic.  HENT:     Head: Normocephalic and atraumatic.  Eyes:     General: No scleral icterus. Cardiovascular:     Rate and Rhythm: Normal rate and regular rhythm.  Pulmonary:     Effort: Pulmonary effort is normal. No respiratory distress.  Abdominal:     General: Bowel sounds are normal.     Palpations: Abdomen is soft.  Musculoskeletal:        General: Normal range of motion.     Cervical back: Normal range of motion and neck supple.  Skin:    General: Skin is warm and dry.     Findings: No erythema.  Neurological:     Mental Status: He is alert and oriented to person, place, and time. Mental status is at baseline.     Cranial Nerves: No cranial nerve deficit.     Motor: No abnormal muscle tone.  Psychiatric:        Mood and Affect: Affect normal.      LABORATORY DATA:  I have reviewed the data as listed    Latest Ref Rng & Units 01/18/2024   11:21 AM 01/17/2024    9:25 AM  07/12/2023    8:03 AM  CBC  WBC 4.0 - 10.5 K/uL 8.3  9.9  5.5   Hemoglobin 13.0 - 17.0 g/dL 87.2  87.1  86.0   Hematocrit 39.0 - 52.0 % 37.0  37.0  41.0   Platelets 150 - 400 K/uL 217  211  203       Latest Ref Rng & Units 01/18/2024    3:10 PM 01/18/2024   11:21 AM 01/18/2024    8:26 AM  CMP  Glucose 70 - 99 mg/dL  852    BUN 8 - 23 mg/dL  7    Creatinine 9.38 - 1.24 mg/dL  9.37    Sodium 864 - 854 mmol/L  145    Potassium 3.5 - 5.1 mmol/L 3.4  2.6  2.7   Chloride 98 - 111 mmol/L  107    CO2 22 - 32 mmol/L  28    Calcium  8.9 - 10.3 mg/dL  9.3    Total Protein 6.5 - 8.1 g/dL  6.7    Total Bilirubin 0.0 - 1.2 mg/dL  1.2    Alkaline Phos 38 - 126 U/L  74    AST 15 - 41 U/L  18    ALT 0 - 44 U/L  20       RADIOGRAPHIC STUDIES: I have personally reviewed the radiological images as listed and agreed with the findings in the report. No results found.

## 2024-01-18 NOTE — Telephone Encounter (Signed)
 9143 am- critical value potassium of 2.7 called by Corean GAILS in cancer center lab. Read back process performed with lab tech. 0900--Josh, NP provided critical value. Read back process performed with NP.  Josh, NP advised patient to to ER for further IV potassium today to correct the critical value. Patient once again advised to the ER by Sidra, NP and Dr. Babara for further manage critical values. Patient reluctant to go to ER for management. Pt has a vacation today and planned to leave for his destination around noon. He stated he did not want to be in the ER for over 8 hours. Patient very emotional. Reports that he is losing trust with his providers. I collaborated with pcp office, who agreed to see pt at 1045 am today. Pt still reluctant to accept pcp apt. He stated, what would they do to correct the critical potassium. What would they do differently. We explained to patient that his health/medical care is important. We asked pcp office to call him directly to further provide recommendations. Per Pcp office, PCP also recommends ER.

## 2024-01-18 NOTE — Assessment & Plan Note (Signed)
 Possibly due to metformin . Recommend patient to follow up with PCP for further evaluation and management.

## 2024-01-18 NOTE — Assessment & Plan Note (Addendum)
 Prostate cancer status post definitive radiation.   12/12/21 2023 PSA trended up without any definitive radiographic evidence of recurrence. increase of 2 ng/ ml above nadir. Raising suspicion of biochemical recurrence. [Interval between radiation and recurrence less than 18 months.  High risk.] Jan 2024 to present  PSA decreased without any intervention.  PSA remain stable at 1.07  Continue observation.

## 2024-01-18 NOTE — Discharge Instructions (Signed)
 Continue taking the oral potassium as prescribed.  Call your primary care provider on Monday as discussed, both to arrange for a follow-up appointment to recheck the potassium level, as well as to discuss possibly changing your diabetes medication.  Return to the ER for new, worsening, or persistent weakness, numbness, dizziness, palpitations, or any other new or worsening symptoms that concern you.

## 2024-01-18 NOTE — Assessment & Plan Note (Signed)
 Refractory hypokalemia of 2.7,  despite received IV KCL 20meq and took 20meq oral KCL yesterday.  Recommend patient to go to ER for further evaluation of treatment.  Etiology of hypokalemia is likely due to GI loss from diarrhea.

## 2024-01-21 ENCOUNTER — Encounter: Payer: Self-pay | Admitting: Oncology

## 2024-01-21 ENCOUNTER — Ambulatory Visit (INDEPENDENT_AMBULATORY_CARE_PROVIDER_SITE_OTHER): Admitting: Internal Medicine

## 2024-01-21 VITALS — BP 102/60 | HR 73 | Temp 98.2°F | Ht 70.0 in | Wt 214.4 lb

## 2024-01-21 DIAGNOSIS — E1165 Type 2 diabetes mellitus with hyperglycemia: Secondary | ICD-10-CM | POA: Diagnosis not present

## 2024-01-21 DIAGNOSIS — E876 Hypokalemia: Secondary | ICD-10-CM | POA: Diagnosis not present

## 2024-01-21 DIAGNOSIS — E119 Type 2 diabetes mellitus without complications: Secondary | ICD-10-CM

## 2024-01-21 DIAGNOSIS — Z0130 Encounter for examination of blood pressure without abnormal findings: Secondary | ICD-10-CM

## 2024-01-21 LAB — POCT CBG (FASTING - GLUCOSE)-MANUAL ENTRY: Glucose Fasting, POC: 143 mg/dL — AB (ref 70–99)

## 2024-01-21 MED ORDER — PIOGLITAZONE HCL 30 MG PO TABS: 30.0000 mg | ORAL_TABLET | Freq: Every day | ORAL | 11 refills | Status: AC

## 2024-01-21 NOTE — Progress Notes (Signed)
 Established Patient Office Visit  Subjective:  Patient ID: Jose Randolph, male    DOB: 1951/01/13  Age: 73 y.o. MRN: [id number]  Chief Complaint  Patient presents with   Follow-up    Medication and additional blood work    C/o chronic diarrhea on Metformin , potassium dropped to 2.6, required supplementation in the ER with improvement to 3.4.    No other concerns at this time.   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 Excised, margin involved.    Past Surgical History:  Procedure Laterality Date   COLONOSCOPY N/A 10/08/2023   Procedure: COLONOSCOPY;  Surgeon: Maryruth Ole DASEN, MD;  Location: [facility] ENDOSCOPY;  Service: Endoscopy;  Laterality: N/A;  DM   COLONOSCOPY WITH PROPOFOL  N/A 10/28/2019   Procedure: COLONOSCOPY WITH PROPOFOL ;  Surgeon: Maryruth Ole DASEN, MD;  Location: [facility] ENDOSCOPY;  Service: Endoscopy;  Laterality: N/A;   FRACTURE SURGERY      Social History   Socioeconomic History   Marital status: Married    Spouse name: Not on file   Number of children: Not on file   Years of education: Not on file   Highest education level: Not on file  Occupational History   Not on file  Tobacco Use   Smoking status: Former    Current packs/day: 0.00    Average packs/day: 0.5 packs/day for 5.0 years (2.5 ttl pk-yrs)    Types: Cigarettes    Start date: 03/06/1978    Quit date: 03/07/1983    Years since quitting: 40.9   Smokeless tobacco: Never  Vaping Use   Vaping status: Never Used  Substance and Sexual Activity   Alcohol use: Yes   Drug use: Never   Sexual activity: Yes    Birth control/protection: None  Other Topics Concern   Not on file  Social History Narrative   Not on file   Social Drivers of Health   Financial Resource  Strain: Not on file  Food Insecurity: Not on file  Transportation Needs: Not on file  Physical Activity: Not on file  Stress: Not on file  Social Connections: Not on file  Intimate Partner Violence: Not on file    Family History  Problem Relation Age of Onset   Colon cancer Father        dx 20s   Prostate cancer Brother 63   Prostate cancer Brother 38   Kidney cancer Brother 59   Cancer Maternal Aunt        unk type   Lung cancer Maternal Uncle    Leukemia Grandson 2    Allergies  Allergen Reactions   Oxycodone    Bee Pollen Itching    Outpatient Medications Prior to Visit  Medication Sig Note   atorvastatin  (LIPITOR) 40 MG tablet TAKE 1 TABLET BY MOUTH EVERY DAY    carvedilol  (COREG  CR) 20 MG 24 hr capsule TAKE 1 CAPSULE BY MOUTH EVERY DAY IN THE MORNING    Cholecalciferol 25 MCG (1000 UT) tablet Take by mouth.    diclofenac Sodium (VOLTAREN) 1 % GEL Apply 4 g topically in the morning and at bedtime. (Patient taking differently: Apply 4 g topically as needed.)    FARXIGA  10 MG TABS tablet TAKE 1 TABLET  BY MOUTH EVERY DAY    halobetasol (ULTRAVATE) 0.05 % ointment Apply topically.    Lancets (ONETOUCH ULTRASOFT) lancets USE WITH DEVICE TO CHECK SUGARS TWICE DAILY DX E11.9    Mechlorethamine HCl (VALCHLOR) 0.016 % GEL Apply topically.    Olmesartan -amLODIPine -HCTZ 40-10-12.5 MG TABS TAKE 1 TABLET BY MOUTH EVERY DAY IN THE MORNING    ONETOUCH ULTRA test strip 2 (two) times daily.    potassium chloride SA (KLOR-CON M) 20 MEQ tablet Take 1 tablet (20 mEq total) by mouth 2 (two) times daily.    [DISCONTINUED] metFORMIN  (GLUCOPHAGE -XR) 500 MG 24 hr tablet Take 4 tablets (2,000 mg total) by mouth daily with breakfast. TAKE 4 TABLETS BY MOUTH DAILY WITH BREAKFAST 01/21/2024: diarrhea   meloxicam (MOBIC) 15 MG tablet Take 1 tablet (15 mg total) by mouth daily. (Patient not taking: Reported on 01/21/2024)    tadalafil (CIALIS) 20 MG tablet EVERY 36 HOURS AS NEEDED FOR ED. (Patient  not taking: Reported on 01/21/2024)    tamsulosin  (FLOMAX ) 0.4 MG CAPS capsule TAKE 1 CAPSULE BY MOUTH 2 TIMES DAILY. (Patient not taking: Reported on 01/21/2024)    No facility-administered medications prior to visit.    Review of Systems  Constitutional:  Negative for malaise/fatigue and weight loss (gained 1 lb).  HENT: Negative.    Eyes: Negative.   Respiratory: Negative.    Cardiovascular: Negative.   Gastrointestinal: Negative.   Genitourinary: Negative.   Musculoskeletal:  Positive for joint pain.  Skin: Negative.   Neurological: Negative.   Endo/Heme/Allergies: Negative.   Psychiatric/Behavioral: Negative.         Objective:   BP 102/60   Pulse 73   Temp 98.2 F (36.8 C)   Ht 5' 10 (1.778 m)   Wt 214 lb 6.4 oz (97.3 kg)   SpO2 96%   BMI 30.76 kg/m   Vitals:   01/21/24 1042  BP: 102/60  Pulse: 73  Temp: 98.2 F (36.8 C)  Height: 5' 10 (1.778 m)  Weight: 214 lb 6.4 oz (97.3 kg)  SpO2: 96%  BMI (Calculated): 30.76    Physical Exam Vitals reviewed.  Constitutional:      Appearance: Normal appearance.  HENT:     Head: Normocephalic.     Left Ear: There is no impacted cerumen.     Nose: Nose normal.     Mouth/Throat:     Mouth: Mucous membranes are moist.     Pharynx: No posterior oropharyngeal erythema.  Eyes:     Extraocular Movements: Extraocular movements intact.     Pupils: Pupils are equal, round, and reactive to light.  Cardiovascular:     Rate and Rhythm: Regular rhythm.     Chest Wall: PMI is not displaced.     Pulses: Normal pulses.     Heart sounds: Normal heart sounds. No murmur heard. Pulmonary:     Effort: Pulmonary effort is normal.     Breath sounds: Normal air entry. No rhonchi or rales.  Abdominal:     General: Abdomen is flat. Bowel sounds are normal. There is no distension.     Palpations: Abdomen is soft. There is no hepatomegaly, splenomegaly or mass.     Tenderness: There is no abdominal tenderness.  Musculoskeletal:         General: Normal range of motion.     Cervical back: Normal range of motion and neck supple.     Right lower leg: No edema.     Left lower leg: No edema.  Skin:  General: Skin is warm and dry.  Neurological:     General: No focal deficit present.     Mental Status: He is alert and oriented to person, place, and time.     Cranial Nerves: No cranial nerve deficit.     Motor: No weakness.  Psychiatric:        Mood and Affect: Mood normal.        Behavior: Behavior normal.      Results for orders placed or performed in visit on 01/21/24  POCT CBG (Fasting - Glucose)  Result Value Ref Range   Glucose Fasting, POC 143 (A) 70 - 99 mg/dL    Recent Results (from the past 2160 hours)  Hepatic function panel     Status: None   Collection Time: 12/18/23  8:22 AM  Result Value Ref Range   Total Protein 6.4 6.0 - 8.5 g/dL   Albumin 3.9 3.8 - 4.8 g/dL   Bilirubin Total 0.9 0.0 - 1.2 mg/dL   Bilirubin, Direct 9.75 0.00 - 0.40 mg/dL   Alkaline Phosphatase 77 47 - 123 IU/L   AST 26 0 - 40 IU/L   ALT 29 0 - 44 IU/L  CK     Status: None   Collection Time: 12/18/23  8:22 AM  Result Value Ref Range   Total CK 122 41 - 331 U/L  Lipid panel     Status: None   Collection Time: 12/18/23  8:22 AM  Result Value Ref Range   Cholesterol, Total 114 100 - 199 mg/dL   Triglycerides 38 0 - 149 mg/dL   HDL 53 >60 mg/dL   VLDL Cholesterol Cal 10 5 - 40 mg/dL   LDL Chol Calc (NIH) 51 0 - 99 mg/dL   Chol/HDL Ratio 2.2 0.0 - 5.0 ratio    Comment:                                   T. Chol/HDL Ratio                                             Men  Women                               1/2 Avg.Risk  3.4    3.3                                   Avg.Risk  5.0    4.4                                2X Avg.Risk  9.6    7.1                                3X Avg.Risk 23.4   11.0   Hemoglobin A1c     Status: Abnormal   Collection Time: 12/18/23  8:22 AM  Result Value Ref Range   Hgb A1c MFr Bld 6.6 (H)  4.8 - 5.6 %    Comment:  Prediabetes: 5.7 - 6.4          Diabetes: >6.4          Glycemic control for adults with diabetes: <7.0    Est. average glucose Bld gHb Est-mCnc 143 mg/dL  POCT CBG (Fasting - Glucose)     Status: Abnormal   Collection Time: 12/24/23 11:48 AM  Result Value Ref Range   Glucose Fasting, POC 137 (A) 70 - 99 mg/dL  POC CREATINE & ALBUMIN,URINE     Status: Normal   Collection Time: 12/24/23 12:08 PM  Result Value Ref Range   Microalbumin Ur, POC 10 mg/L   Creatinine, POC 50 mg/dL   Albumin/Creatinine Ratio, Urine, POC <30   PSA     Status: None   Collection Time: 01/17/24  9:24 AM  Result Value Ref Range   Prostatic Specific Antigen 1.07 0.00 - 4.00 ng/mL    Comment: (NOTE) While PSA levels of <=4.00 ng/ml are reported as reference range, some men with levels below 4.00 ng/ml can have prostate cancer and many men with PSA above 4.00 ng/ml do not have prostate cancer.  Other tests such as free PSA, age specific reference ranges, PSA velocity and PSA doubling time may be helpful especially in men less than 17 years old. Performed at [facility], [address], [place], [place] [address]   CMP (Cancer Center only)     Status: Abnormal   Collection Time: 01/17/24  9:24 AM  Result Value Ref Range   Sodium 144 135 - 145 mmol/L   Potassium 2.6 (LL) 3.5 - 5.1 mmol/L    Comment: CRITICAL RESULT CALLED TO, READ BACK BY AND VERIFIED WITH ELIZABETH SANTOS 01/17/2024 @ 951 PERRY, J.   Chloride 108 98 - 111 mmol/L   CO2 25 22 - 32 mmol/L   Glucose, Bld 129 (H) 70 - 99 mg/dL    Comment: Glucose reference range applies only to samples taken after fasting for at least 8 hours.   BUN 11 8 - 23 mg/dL   Creatinine 9.26 9.38 - 1.24 mg/dL   Calcium  9.1 8.9 - 10.3 mg/dL   Total Protein 7.1 6.5 - 8.1 g/dL   Albumin 3.8 3.5 - 5.0 g/dL   AST 22 15 - 41 U/L   ALT 21 0 - 44 U/L   Alkaline Phosphatase 62 38 - 126 U/L   Total Bilirubin 1.7 (H) 0.0 - 1.2 mg/dL    GFR, Estimated >39 >39 mL/min    Comment: (NOTE) Calculated using the CKD-EPI Creatinine Equation (2021)    Anion gap 11 5 - 15    Comment: Performed at [facility], [address] [place], [place] [address]  Magnesium     Status: None   Collection Time: 01/17/24  9:24 AM  Result Value Ref Range   Magnesium 1.7 1.7 - 2.4 mg/dL    Comment: Performed at [facility], [address] [place], [place] [address]  CBC with Differential (Cancer Center Only)     Status: Abnormal   Collection Time: 01/17/24  9:25 AM  Result Value Ref Range   WBC Count 9.9 4.0 - 10.5 K/uL   RBC 4.09 (L) 4.22 - 5.81 MIL/uL   Hemoglobin 12.8 (L) 13.0 - 17.0 g/dL   HCT 62.9 (L) 60.9 - 47.9 %   MCV 90.5 80.0 - 100.0 fL   MCH 31.3 26.0 - 34.0 pg   MCHC 34.6 30.0 - 36.0 g/dL   RDW 86.0 88.4 - 84.4 %  Platelet Count 211 150 - 400 K/uL   nRBC 0.0 0.0 - 0.2 %   Neutrophils Relative % 67 %   Neutro Abs 6.7 1.7 - 7.7 K/uL   Lymphocytes Relative 23 %   Lymphs Abs 2.3 0.7 - 4.0 K/uL   Monocytes Relative 7 %   Monocytes Absolute 0.7 0.1 - 1.0 K/uL   Eosinophils Relative 2 %   Eosinophils Absolute 0.2 0.0 - 0.5 K/uL   Basophils Relative 1 %   Basophils Absolute 0.1 0.0 - 0.1 K/uL   Immature Granulocytes 0 %   Abs Immature Granulocytes 0.03 0.00 - 0.07 K/uL    Comment: Performed at [facility], [address] [place], [place] [address]  Potassium     Status: Abnormal   Collection Time: 01/18/24  8:26 AM  Result Value Ref Range   Potassium 2.7 (LL) 3.5 - 5.1 mmol/L    Comment: CRITICAL RESULT CALLED TO, READ BACK BY AND VERIFIED WITH Powell Lin RN at [phone] on 11.14.25 SV Performed at [facility], [address] [place], [place] [address]   Comprehensive metabolic panel     Status: Abnormal   Collection Time: 01/18/24 11:21 AM  Result Value Ref Range   Sodium 145 135 - 145 mmol/L   Potassium 2.6 (LL) 3.5 - 5.1 mmol/L    Comment: Critical Value, Read Back and verified with  Rock McLamb 01/18/24 1156 KLW   Chloride 107 98 - 111 mmol/L   CO2 28 22 - 32 mmol/L   Glucose, Bld 147 (H) 70 - 99 mg/dL    Comment: Glucose reference range applies only to samples taken after fasting for at least 8 hours.   BUN 7 (L) 8 - 23 mg/dL   Creatinine, Ser 9.37 0.61 - 1.24 mg/dL   Calcium  9.3 8.9 - 10.3 mg/dL   Total Protein 6.7 6.5 - 8.1 g/dL   Albumin 4.2 3.5 - 5.0 g/dL   AST 18 15 - 41 U/L   ALT 20 0 - 44 U/L   Alkaline Phosphatase 74 38 - 126 U/L   Total Bilirubin 1.2 0.0 - 1.2 mg/dL   GFR, Estimated >39 >39 mL/min    Comment: (NOTE) Calculated using the CKD-EPI Creatinine Equation (2021)    Anion gap 10 5 - 15    Comment: Performed at [facility], [address] [place], [place] [address]  CBC with Differential     Status: Abnormal   Collection Time: 01/18/24 11:21 AM  Result Value Ref Range   WBC 8.3 4.0 - 10.5 K/uL   RBC 4.04 (L) 4.22 - 5.81 MIL/uL   Hemoglobin 12.7 (L) 13.0 - 17.0 g/dL   HCT 62.9 (L) 60.9 - 47.9 %   MCV 91.6 80.0 - 100.0 fL   MCH 31.4 26.0 - 34.0 pg   MCHC 34.3 30.0 - 36.0 g/dL   RDW 86.0 88.4 - 84.4 %   Platelets 217 150 - 400 K/uL   nRBC 0.0 0.0 - 0.2 %   Neutrophils Relative % 68 %   Neutro Abs 5.7 1.7 - 7.7 K/uL   Lymphocytes Relative 22 %   Lymphs Abs 1.9 0.7 - 4.0 K/uL   Monocytes Relative 7 %   Monocytes Absolute 0.6 0.1 - 1.0 K/uL   Eosinophils Relative 2 %   Eosinophils Absolute 0.2 0.0 - 0.5 K/uL   Basophils Relative 1 %   Basophils Absolute 0.1 0.0 - 0.1 K/uL   Immature Granulocytes 0 %   Abs  Immature Granulocytes 0.02 0.00 - 0.07 K/uL    Comment: Performed at [facility], [address] [place], [place] [address]  Potassium     Status: Abnormal   Collection Time: 01/18/24  3:10 PM  Result Value Ref Range   Potassium 3.4 (L) 3.5 - 5.1 mmol/L    Comment: Performed at [facility], [address] [place], [place] [address]  POCT CBG (Fasting - Glucose)     Status: Abnormal   Collection  Time: 01/21/24 10:50 AM  Result Value Ref Range   Glucose Fasting, POC 143 (A) 70 - 99 mg/dL      Assessment & Plan:  Jose Randolph was seen today for follow-up.  Type 2 diabetes mellitus without complication, without long-term current use of insulin (HCC) -     POCT CBG (Fasting - Glucose) -     Pioglitazone HCl; Take 1 tablet (30 mg total) by mouth daily.  Dispense: 30 tablet; Refill: 11  Hypokalemia -     BMP8+Anion Gap    Problem List Items Addressed This Visit       Endocrine   Type 2 diabetes mellitus without complication, without long-term current use of insulin (HCC) - Primary   Relevant Medications   pioglitazone (ACTOS) 30 MG tablet   Other Relevant Orders   POCT CBG (Fasting - Glucose) (Completed)     Other   Hypokalemia (Chronic)   Relevant Orders   BMP8+Anion Gap    Return if symptoms worsen or fail to improve.   Total time spent: 20 minutes. This time includes review of previous notes and results and patient face to face interaction during today'Jose Randolph visit.    Sherrill Cinderella Perry, MD  01/21/2024   This document may have been prepared by [facility] Voice Recognition software and as such may include unintentional dictation errors.

## 2024-01-21 NOTE — Telephone Encounter (Signed)
-----   Message from Zelphia Cap sent at 01/18/2024  8:54 PM EST ----- Please arrange him to follow up in 6 months lab prior to MD  I have ordered labs.  ----- Message ----- From: Rebecka, [facility] [place] Sent: 01/17/2024   9:49 AM EST To: Zelphia Cap, MD

## 2024-01-22 ENCOUNTER — Other Ambulatory Visit: Payer: Self-pay | Admitting: Internal Medicine

## 2024-01-22 ENCOUNTER — Ambulatory Visit: Payer: Self-pay | Admitting: Internal Medicine

## 2024-01-22 DIAGNOSIS — E876 Hypokalemia: Secondary | ICD-10-CM

## 2024-01-22 LAB — BMP8+ANION GAP
Anion Gap: 16 mmol/L (ref 10.0–18.0)
BUN/Creatinine Ratio: 10 (ref 10–24)
BUN: 8 mg/dL (ref 8–27)
CO2: 23 mmol/L (ref 20–29)
Calcium: 9.3 mg/dL (ref 8.6–10.2)
Chloride: 106 mmol/L (ref 96–106)
Creatinine, Ser: 0.77 mg/dL (ref 0.76–1.27)
Glucose: 136 mg/dL — ABNORMAL HIGH (ref 70–99)
Potassium: 3 mmol/L — ABNORMAL LOW (ref 3.5–5.2)
Sodium: 145 mmol/L — ABNORMAL HIGH (ref 134–144)
eGFR: 96 mL/min/1.73 (ref >59)

## 2024-01-22 MED ORDER — POTASSIUM CHLORIDE CRYS ER 20 MEQ PO TBCR: 20.0000 meq | EXTENDED_RELEASE_TABLET | Freq: Three times a day (TID) | ORAL | 0 refills | Status: DC

## 2024-01-23 ENCOUNTER — Other Ambulatory Visit: Payer: Self-pay | Admitting: Internal Medicine

## 2024-01-23 DIAGNOSIS — M1712 Unilateral primary osteoarthritis, left knee: Secondary | ICD-10-CM

## 2024-01-23 NOTE — Progress Notes (Signed)
Pt informed

## 2024-01-24 ENCOUNTER — Ambulatory Visit
Admission: RE | Admit: 2024-01-24 | Discharge: 2024-01-24 | Disposition: A | Discharge status: Home / Self Care | Payer: Medicare Other | Source: Ambulatory Visit | Attending: Radiation Oncology | Admitting: Radiation Oncology

## 2024-01-24 ENCOUNTER — Encounter: Payer: Self-pay | Admitting: Radiation Oncology

## 2024-01-24 ENCOUNTER — Ambulatory Visit: Admitting: Oncology

## 2024-01-24 VITALS — BP 102/65 | HR 68 | Temp 97.0°F | Resp 16 | Ht 70.0 in | Wt 212.6 lb

## 2024-01-24 DIAGNOSIS — Z923 Personal history of irradiation: Secondary | ICD-10-CM | POA: Insufficient documentation

## 2024-01-24 DIAGNOSIS — C61 Malignant neoplasm of prostate: Secondary | ICD-10-CM | POA: Insufficient documentation

## 2024-01-24 DIAGNOSIS — E876 Hypokalemia: Secondary | ICD-10-CM | POA: Diagnosis not present

## 2024-01-24 NOTE — Progress Notes (Signed)
 Radiation Oncology Follow up Note  Name: Jose Randolph   Date:   01/24/2024 MRN:  [id number] DOB: Dec 27, 1950    This 73 y.o. male presents to the clinic today for 3-1/2-year follow-up status post image guided IMRT radiation therapy for Gleason 7 (4+3) adenocarcinoma the prostate presented with a PSA in the 10 range.  REFERRING PROVIDER: Albina GORMAN Dine, MD  HPI: Patient is a 73 year old male now out over 3-1/2 years having completed IMRT radiation therapy for Gleason 7 adenocarcinoma the prostate seen today in routine follow-up he is doing well.  He specifically denies any increased lower Neri tract symptoms diarrhea or fatigue.SABRA  His PSA over time is stable now 1.07 down from 1.526 months ago.  He recently was diagnosed with hypokalemia and received IV KCl as well as oral KCl.  COMPLICATIONS OF TREATMENT: none  FOLLOW UP COMPLIANCE: keeps appointments   PHYSICAL EXAM:  BP 102/65   Pulse 68   Temp (!) 97 F (36.1 C)   Resp 16   Ht 5' 10 (1.778 m)   Wt 212 lb 9.6 oz (96.4 kg)   BMI 30.50 kg/m  Well-developed well-nourished patient in NAD. HEENT reveals PERLA, EOMI, discs not visualized.  Oral cavity is clear. No oral mucosal lesions are identified. Neck is clear without evidence of cervical or supraclavicular adenopathy. Lungs are clear to A&P. Cardiac examination is essentially unremarkable with regular rate and rhythm without murmur rub or thrill. Abdomen is benign with no organomegaly or masses noted. Motor sensory and DTR levels are equal and symmetric in the upper and lower extremities. Cranial nerves II through XII are grossly intact. Proprioception is intact. No peripheral adenopathy or edema is identified. No motor or sensory levels are noted. Crude visual fields are within normal range.  RADIOLOGY RESULTS: No current films for review  PLAN: Present time patient continues under excellent biochemical control of his prostate cancer.  I am pleased with his overall progress.   He continues close follow-up care with medical oncology.  He is being treated for hypokalemia at this time.  I will turn follow-up care over to them be happy to reevaluate the patient in time the future should that be indicated.  I would like to take this opportunity to thank you for allowing me to participate in the care of your patient.SABRA Marcey Penton, MD

## 2024-01-28 ENCOUNTER — Encounter: Payer: Self-pay | Admitting: Internal Medicine

## 2024-01-30 ENCOUNTER — Other Ambulatory Visit: Payer: Self-pay | Admitting: Internal Medicine

## 2024-01-30 DIAGNOSIS — E119 Type 2 diabetes mellitus without complications: Secondary | ICD-10-CM

## 2024-01-30 MED ORDER — ONETOUCH ULTRA 2 W/DEVICE KIT: 1.0000 | PACK | Freq: Once | 0 refills | Status: AC

## 2024-01-30 MED ORDER — ONETOUCH ULTRA VI STRP
ORAL_STRIP | 12 refills | Status: AC
Start: 2024-01-30 — End: ?

## 2024-02-05 ENCOUNTER — Other Ambulatory Visit

## 2024-02-06 ENCOUNTER — Other Ambulatory Visit: Payer: Self-pay

## 2024-02-06 DIAGNOSIS — E876 Hypokalemia: Secondary | ICD-10-CM

## 2024-02-06 LAB — BMP8+ANION GAP
Anion Gap: 13 mmol/L (ref 10.0–18.0)
BUN/Creatinine Ratio: 12 (ref 10–24)
BUN: 11 mg/dL (ref 8–27)
CO2: 23 mmol/L (ref 20–29)
Calcium: 9.6 mg/dL (ref 8.6–10.2)
Chloride: 105 mmol/L (ref 96–106)
Creatinine, Ser: 0.91 mg/dL (ref 0.76–1.27)
Glucose: 121 mg/dL — ABNORMAL HIGH (ref 70–99)
Potassium: 4.4 mmol/L (ref 3.5–5.2)
Sodium: 141 mmol/L (ref 134–144)
eGFR: 90 mL/min/1.73 (ref >59)

## 2024-02-06 NOTE — Progress Notes (Signed)
Pt informed

## 2024-02-08 ENCOUNTER — Other Ambulatory Visit: Payer: Self-pay | Admitting: Internal Medicine

## 2024-02-08 MED ORDER — POTASSIUM CHLORIDE CRYS ER 20 MEQ PO TBCR: 20.0000 meq | EXTENDED_RELEASE_TABLET | Freq: Three times a day (TID) | ORAL | 0 refills | Status: DC

## 2024-02-11 ENCOUNTER — Other Ambulatory Visit: Payer: Self-pay | Admitting: Internal Medicine

## 2024-02-11 DIAGNOSIS — I10 Essential (primary) hypertension: Secondary | ICD-10-CM

## 2024-03-07 ENCOUNTER — Other Ambulatory Visit: Payer: Self-pay

## 2024-03-07 DIAGNOSIS — E876 Hypokalemia: Secondary | ICD-10-CM

## 2024-03-10 MED ORDER — POTASSIUM CHLORIDE CRYS ER 20 MEQ PO TBCR: 20.0000 meq | EXTENDED_RELEASE_TABLET | Freq: Three times a day (TID) | ORAL | 0 refills | Status: DC

## 2024-03-19 ENCOUNTER — Other Ambulatory Visit

## 2024-03-19 DIAGNOSIS — E119 Type 2 diabetes mellitus without complications: Secondary | ICD-10-CM

## 2024-03-19 DIAGNOSIS — E782 Mixed hyperlipidemia: Secondary | ICD-10-CM

## 2024-03-20 ENCOUNTER — Other Ambulatory Visit: Payer: Self-pay | Admitting: Internal Medicine

## 2024-03-20 DIAGNOSIS — E119 Type 2 diabetes mellitus without complications: Secondary | ICD-10-CM

## 2024-03-20 DIAGNOSIS — I10 Essential (primary) hypertension: Secondary | ICD-10-CM

## 2024-03-20 LAB — LIPID PANEL
Chol/HDL Ratio: 2.4 ratio (ref 0.0–5.0)
Cholesterol, Total: 134 mg/dL (ref 100–199)
HDL: 55 mg/dL
LDL Chol Calc (NIH): 70 mg/dL (ref 0–99)
Triglycerides: 38 mg/dL (ref 0–149)
VLDL Cholesterol Cal: 9 mg/dL (ref 5–40)

## 2024-03-20 LAB — HEMOGLOBIN A1C
Est. average glucose Bld gHb Est-mCnc: 151 mg/dL
Hgb A1c MFr Bld: 6.9 % — ABNORMAL HIGH (ref 4.8–5.6)

## 2024-03-25 ENCOUNTER — Ambulatory Visit: Payer: Self-pay | Admitting: Internal Medicine

## 2024-03-25 ENCOUNTER — Ambulatory Visit: Admitting: Internal Medicine

## 2024-03-25 VITALS — BP 122/74 | HR 79 | Ht 70.0 in | Wt 222.8 lb

## 2024-03-25 DIAGNOSIS — I10 Essential (primary) hypertension: Secondary | ICD-10-CM | POA: Diagnosis not present

## 2024-03-25 DIAGNOSIS — E1165 Type 2 diabetes mellitus with hyperglycemia: Secondary | ICD-10-CM

## 2024-03-25 DIAGNOSIS — E782 Mixed hyperlipidemia: Secondary | ICD-10-CM

## 2024-03-25 DIAGNOSIS — E876 Hypokalemia: Secondary | ICD-10-CM | POA: Diagnosis not present

## 2024-03-25 DIAGNOSIS — E119 Type 2 diabetes mellitus without complications: Secondary | ICD-10-CM

## 2024-03-25 LAB — POCT CBG (FASTING - GLUCOSE)-MANUAL ENTRY: Glucose Fasting, POC: 134 mg/dL — AB (ref 70–99)

## 2024-03-25 MED ORDER — DAPAGLIFLOZIN PROPANEDIOL 10 MG PO TABS: 10.0000 mg | ORAL_TABLET | Freq: Every day | ORAL | 0 refills | Status: AC

## 2024-03-25 NOTE — Progress Notes (Signed)
 "  Established Patient Office Visit  Subjective:  Patient ID: Jose Randolph, male    DOB: 02/26/1951  Age: 74 y.o. MRN: [id number]  Chief Complaint  Patient presents with   Follow-up    3 month follow up    No new complaints, here for lab review and medication refills. Labs reviewed and notable for well controlled diabetes, A1c higher but still at target, lipids at target. Denies any hypoglycemic episodes and home bg readings have been at target. Stopped taking potassium.    No other concerns at this time.   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 Excised, margin involved.    Past Surgical History:  Procedure Laterality Date   COLONOSCOPY N/A 10/08/2023   Procedure: COLONOSCOPY;  Surgeon: Maryruth Ole DASEN, MD;  Location: [facility] ENDOSCOPY;  Service: Endoscopy;  Laterality: N/A;  DM   COLONOSCOPY WITH PROPOFOL  N/A 10/28/2019   Procedure: COLONOSCOPY WITH PROPOFOL ;  Surgeon: Maryruth Ole DASEN, MD;  Location: [facility] ENDOSCOPY;  Service: Endoscopy;  Laterality: N/A;   FRACTURE SURGERY      Social History   Socioeconomic History   Marital status: Married    Spouse name: Not on file   Number of children: Not on file   Years of education: Not on file   Highest education level: Not on file  Occupational History   Not on file  Tobacco Use   Smoking status: Former    Current packs/day: 0.00    Average packs/day: 0.5 packs/day for 5.0 years (2.5 ttl pk-yrs)    Types: Cigarettes    Start date: 03/06/1978    Quit date: 03/07/1983    Years since quitting: 41.0   Smokeless tobacco: Never  Vaping Use   Vaping status: Never Used  Substance and Sexual Activity   Alcohol use: Yes   Drug use: Never   Sexual activity: Yes    Birth control/protection: None   Other Topics Concern   Not on file  Social History Narrative   Not on file   Social Drivers of Health   Tobacco Use: Medium Risk (02/13/2024)   [facility] from [facility]   Patient History    Smoking Tobacco Use: Former    Smokeless Tobacco Use: Never    Passive Exposure: Not on Actuary Strain: Not on file  Food Insecurity: Not on file  Transportation Needs: Not on file  Physical Activity: Not on file  Stress: Not on file  Social Connections: Not on file  Intimate Partner Violence: Not on file  Depression (PHQ2-9): Low Risk (01/24/2024)   Depression (PHQ2-9)    PHQ-2 Score: 0  Alcohol Screen: Not on file  Housing: Unknown (03/10/2024)   Received from [facility]   Epic    Unable to Pay for Housing in the Last Year: Not on file    Number of Times Moved in the Last Year: Not on file    At any time in the past 12 months, were you homeless or living in a shelter (including now)?: No  Utilities: Not on file  Health Literacy: Not on file    Family History  Problem Relation Age of Onset   Colon cancer Father  dx 62s   Prostate cancer Brother 43   Prostate cancer Brother 70   Kidney cancer Brother 14   Cancer Maternal Aunt        unk type   Lung cancer Maternal Uncle    Leukemia Grandson 2    Allergies[1]  Show/hide medication list[2]  Review of Systems  Constitutional:  Negative for malaise/fatigue and weight loss (gained 1 lb).  HENT: Negative.    Eyes: Negative.   Respiratory: Negative.    Cardiovascular: Negative.   Gastrointestinal: Negative.   Genitourinary: Negative.   Musculoskeletal:  Positive for joint pain.  Skin: Negative.   Neurological: Negative.   Endo/Heme/Allergies: Negative.   Psychiatric/Behavioral: Negative.         Objective:   BP 122/74   Pulse 79   Ht 5' 10 (1.778 m)   Wt 222 lb 12.8 oz (101.1 kg)   SpO2 95%   BMI 31.97 kg/m   Vitals:   03/25/24 1010  BP: 122/74   Pulse: 79  Height: 5' 10 (1.778 m)  Weight: 222 lb 12.8 oz (101.1 kg)  SpO2: 95%  BMI (Calculated): 31.97    Physical Exam Vitals reviewed.  Constitutional:      Appearance: Normal appearance.  HENT:     Head: Normocephalic.     Left Ear: There is no impacted cerumen.     Nose: Nose normal.     Mouth/Throat:     Mouth: Mucous membranes are moist.     Pharynx: No posterior oropharyngeal erythema.  Eyes:     Extraocular Movements: Extraocular movements intact.     Pupils: Pupils are equal, round, and reactive to light.  Cardiovascular:     Rate and Rhythm: Regular rhythm.     Chest Wall: PMI is not displaced.     Pulses: Normal pulses.     Heart sounds: Normal heart sounds. No murmur heard. Pulmonary:     Effort: Pulmonary effort is normal.     Breath sounds: Normal air entry. No rhonchi or rales.  Abdominal:     General: Abdomen is flat. Bowel sounds are normal. There is no distension.     Palpations: Abdomen is soft. There is no hepatomegaly, splenomegaly or mass.     Tenderness: There is no abdominal tenderness.  Musculoskeletal:        General: Normal range of motion.     Cervical back: Normal range of motion and neck supple.     Right lower leg: No edema.     Left lower leg: No edema.  Skin:    General: Skin is warm and dry.  Neurological:     General: No focal deficit present.     Mental Status: Jose Randolph is alert and oriented to person, place, and time.     Cranial Nerves: No cranial nerve deficit.     Motor: No weakness.  Psychiatric:        Mood and Affect: Mood normal.        Behavior: Behavior normal.      Results for orders placed or performed in visit on 03/25/24  POCT CBG (Fasting - Glucose)  Result Value Ref Range   Glucose Fasting, POC 134 (A) 70 - 99 mg/dL        Assessment & Plan:  Jose Randolph was seen today for follow-up.  Type 2 diabetes mellitus without complication, without long-term current use of insulin (HCC) -     POCT CBG (Fasting -  Glucose) -     Dapagliflozin   Propanediol; Take 1 tablet (10 mg total) by mouth daily.  Dispense: 90 tablet; Refill: 0 -     Hemoglobin A1c  Essential (primary) hypertension -     Comprehensive metabolic panel with GFR  Hypokalemia -     Basic metabolic panel with GFR -     Comprehensive metabolic panel with GFR  Mixed hyperlipidemia -     Lipid panel    Problem List Items Addressed This Visit       Endocrine   Type 2 diabetes mellitus without complication, without long-term current use of insulin (HCC) - Primary   Relevant Medications   dapagliflozin  propanediol (FARXIGA ) 10 MG TABS tablet   Other Relevant Orders   POCT CBG (Fasting - Glucose) (Completed)     Other   Hypokalemia (Chronic)   Relevant Orders   Basic metabolic panel with GFR   Comprehensive metabolic panel with GFR   Mixed hyperlipidemia   Other Visit Diagnoses       Essential (primary) hypertension       Relevant Orders   Comprehensive metabolic panel with GFR       Return in about 3 months (around 06/23/2024) for fu with labs prior.   Total time spent: 20 minutes. This time includes review of previous notes and results and patient face to face interaction during today'Jose Randolph visit.    Sherrill Cinderella Perry, MD  03/25/2024   This document may have been prepared by [facility] Voice Recognition software and as such may include unintentional dictation errors.     [1]  Allergies Allergen Reactions   Oxycodone    Bee Pollen Itching  [2]  Outpatient Medications Prior to Visit  Medication Sig   atorvastatin  (LIPITOR) 40 MG tablet TAKE 1 TABLET BY MOUTH EVERY DAY   carvedilol  (COREG  CR) 20 MG 24 hr capsule TAKE 1 CAPSULE BY MOUTH EVERY DAY IN THE MORNING   Cholecalciferol 25 MCG (1000 UT) tablet Take by mouth.   diclofenac  Sodium (VOLTAREN ) 1 % GEL Apply 4 g topically in the morning and at bedtime. (Patient taking differently: Apply 4 g topically as needed.)   glucose blood (ONETOUCH ULTRA) test strip  Use as instructed   halobetasol (ULTRAVATE) 0.05 % ointment Apply topically.   Lancets (ONETOUCH ULTRASOFT) lancets USE WITH DEVICE TO CHECK SUGARS TWICE DAILY DX E11.9   Mechlorethamine HCl (VALCHLOR) 0.016 % GEL Apply topically.   Olmesartan -amLODIPine -HCTZ 40-10-12.5 MG TABS TAKE 1 TABLET BY MOUTH EVERY DAY IN THE MORNING   pioglitazone  (ACTOS ) 30 MG tablet Take 1 tablet (30 mg total) by mouth daily.   potassium chloride  SA (KLOR-CON  M) 20 MEQ tablet Take 1 tablet (20 mEq total) by mouth 3 (three) times daily.   [DISCONTINUED] FARXIGA  10 MG TABS tablet TAKE 1 TABLET BY MOUTH EVERY DAY   tadalafil (CIALIS) 20 MG tablet EVERY 36 HOURS AS NEEDED FOR ED. (Patient not taking: Reported on 03/25/2024)   tamsulosin  (FLOMAX ) 0.4 MG CAPS capsule TAKE 1 CAPSULE BY MOUTH 2 TIMES DAILY. (Patient not taking: Reported on 03/25/2024)   [DISCONTINUED] meloxicam  (MOBIC ) 15 MG tablet Take 1 tablet (15 mg total) by mouth daily. (Patient not taking: Reported on 03/25/2024)   No facility-administered medications prior to visit.   "

## 2024-03-26 LAB — BASIC METABOLIC PANEL WITH GFR
BUN/Creatinine Ratio: 24 (ref 10–24)
BUN: 22 mg/dL (ref 8–27)
CO2: 20 mmol/L (ref 20–29)
Calcium: 9.9 mg/dL (ref 8.6–10.2)
Chloride: 106 mmol/L (ref 96–106)
Creatinine, Ser: 0.92 mg/dL (ref 0.76–1.27)
Glucose: 124 mg/dL — ABNORMAL HIGH (ref 70–99)
Potassium: 4.2 mmol/L (ref 3.5–5.2)
Sodium: 143 mmol/L (ref 134–144)
eGFR: 89 mL/min/1.73

## 2024-03-26 LAB — SPECIMEN STATUS REPORT

## 2024-03-27 ENCOUNTER — Other Ambulatory Visit: Payer: Self-pay | Admitting: Internal Medicine

## 2024-03-27 DIAGNOSIS — E876 Hypokalemia: Secondary | ICD-10-CM

## 2024-03-27 MED ORDER — POTASSIUM CHLORIDE CRYS ER 20 MEQ PO TBCR: 20.0000 meq | EXTENDED_RELEASE_TABLET | Freq: Two times a day (BID) | ORAL | Status: AC

## 2024-06-24 ENCOUNTER — Ambulatory Visit: Admitting: Internal Medicine

## 2024-07-17 ENCOUNTER — Inpatient Hospital Stay

## 2024-07-22 ENCOUNTER — Inpatient Hospital Stay: Admitting: Oncology
